# Patient Record
Sex: Female | Born: 1955 | Race: White | Hispanic: No | Marital: Married | Smoking: Former smoker
Health system: Southern US, Community
[De-identification: ages and names within clinical notes are randomized; demographics above are authoritative.]

## PROBLEM LIST (undated history)

## (undated) DIAGNOSIS — Z860100 Personal history of colon polyps, unspecified: Secondary | ICD-10-CM

## (undated) DIAGNOSIS — Z803 Family history of malignant neoplasm of breast: Secondary | ICD-10-CM

## (undated) DIAGNOSIS — C3490 Malignant neoplasm of unspecified part of unspecified bronchus or lung: Secondary | ICD-10-CM

## (undated) DIAGNOSIS — R0602 Shortness of breath: Secondary | ICD-10-CM

## (undated) DIAGNOSIS — E785 Hyperlipidemia, unspecified: Secondary | ICD-10-CM

## (undated) DIAGNOSIS — G4700 Insomnia, unspecified: Secondary | ICD-10-CM

## (undated) DIAGNOSIS — R911 Solitary pulmonary nodule: Secondary | ICD-10-CM

## (undated) DIAGNOSIS — K219 Gastro-esophageal reflux disease without esophagitis: Secondary | ICD-10-CM

## (undated) DIAGNOSIS — Z0000 Encounter for general adult medical examination without abnormal findings: Secondary | ICD-10-CM

## (undated) DIAGNOSIS — D869 Sarcoidosis, unspecified: Secondary | ICD-10-CM

## (undated) DIAGNOSIS — Z85828 Personal history of other malignant neoplasm of skin: Secondary | ICD-10-CM

## (undated) DIAGNOSIS — Z800 Family history of malignant neoplasm of digestive organs: Secondary | ICD-10-CM

## (undated) DIAGNOSIS — I493 Ventricular premature depolarization: Principal | ICD-10-CM

## (undated) DIAGNOSIS — Z6372 Alcoholism and drug addiction in family: Secondary | ICD-10-CM

## (undated) HISTORY — DX: Personal history of colonic polyps: Z86.010

## (undated) HISTORY — DX: Hyperlipidemia, unspecified: E78.5

## (undated) HISTORY — DX: Insomnia, unspecified: G47.00

## (undated) HISTORY — DX: Personal history of other malignant neoplasm of skin: Z85.828

## (undated) HISTORY — DX: Encounter for general adult medical examination without abnormal findings: Z00.00

## (undated) HISTORY — DX: Family history of malignant neoplasm of digestive organs: Z80.0

## (undated) HISTORY — DX: Gastro-esophageal reflux disease without esophagitis: K21.9

## (undated) HISTORY — DX: Family history of malignant neoplasm of breast: Z80.3

## (undated) HISTORY — DX: Personal history of colon polyps, unspecified: Z86.0100

## (undated) HISTORY — DX: Solitary pulmonary nodule: R91.1

## (undated) HISTORY — DX: Shortness of breath: R06.02

## (undated) HISTORY — DX: Alcoholism and drug addiction in family: Z63.72

## (undated) HISTORY — DX: Sarcoidosis, unspecified: D86.9

## (undated) HISTORY — DX: Ventricular premature depolarization: I49.3

## (undated) HISTORY — PX: TONSILLECTOMY: SHX5217

## (undated) HISTORY — DX: Malignant neoplasm of unspecified part of unspecified bronchus or lung: C34.90

## (undated) HISTORY — PX: BASAL CELL CARCINOMA EXCISION: SHX1214

---

## 1998-04-03 ENCOUNTER — Other Ambulatory Visit: Admission: RE | Admit: 1998-04-03 | Discharge: 1998-04-03 | Payer: Self-pay | Admitting: Obstetrics and Gynecology

## 1999-04-07 ENCOUNTER — Other Ambulatory Visit: Admission: RE | Admit: 1999-04-07 | Discharge: 1999-04-07 | Payer: Self-pay | Admitting: Obstetrics and Gynecology

## 2000-04-20 ENCOUNTER — Other Ambulatory Visit: Admission: RE | Admit: 2000-04-20 | Discharge: 2000-04-20 | Payer: Self-pay | Admitting: Obstetrics and Gynecology

## 2001-04-26 ENCOUNTER — Other Ambulatory Visit: Admission: RE | Admit: 2001-04-26 | Discharge: 2001-04-26 | Payer: Self-pay | Admitting: Obstetrics and Gynecology

## 2002-05-17 ENCOUNTER — Other Ambulatory Visit: Admission: RE | Admit: 2002-05-17 | Discharge: 2002-05-17 | Payer: Self-pay | Admitting: Obstetrics and Gynecology

## 2002-08-02 ENCOUNTER — Encounter: Admission: RE | Admit: 2002-08-02 | Discharge: 2002-09-11 | Payer: Self-pay | Admitting: Internal Medicine

## 2002-09-17 ENCOUNTER — Encounter: Admission: RE | Admit: 2002-09-17 | Discharge: 2002-10-16 | Payer: Self-pay | Admitting: Internal Medicine

## 2003-05-21 ENCOUNTER — Other Ambulatory Visit: Admission: RE | Admit: 2003-05-21 | Discharge: 2003-05-21 | Payer: Self-pay | Admitting: Obstetrics and Gynecology

## 2004-06-18 ENCOUNTER — Other Ambulatory Visit: Admission: RE | Admit: 2004-06-18 | Discharge: 2004-06-18 | Payer: Self-pay | Admitting: Obstetrics and Gynecology

## 2007-08-08 ENCOUNTER — Ambulatory Visit: Payer: Self-pay | Admitting: Internal Medicine

## 2007-08-08 LAB — CONVERTED CEMR LAB
ALT: 12 units/L (ref 0–35)
Basophils Absolute: 0 10*3/uL (ref 0.0–0.1)
Basophils Relative: 0.8 % (ref 0.0–3.0)
CO2: 28 meq/L (ref 19–32)
Calcium: 9 mg/dL (ref 8.4–10.5)
Cholesterol: 235 mg/dL (ref 0–200)
Creatinine, Ser: 1 mg/dL (ref 0.4–1.2)
Direct LDL: 152.6 mg/dL
Eosinophils Absolute: 0.1 10*3/uL (ref 0.0–0.7)
Hemoglobin: 13.9 g/dL (ref 12.0–15.0)
Ketones, ur: NEGATIVE mg/dL
Leukocytes, UA: NEGATIVE
Lymphocytes Relative: 29.6 % (ref 12.0–46.0)
Monocytes Relative: 6.6 % (ref 3.0–12.0)
Neutro Abs: 3.3 10*3/uL (ref 1.4–7.7)
Neutrophils Relative %: 60.3 % (ref 43.0–77.0)
Nitrite: NEGATIVE
RBC: 4.42 M/uL (ref 3.87–5.11)
Specific Gravity, Urine: 1.015 (ref 1.000–1.03)
TSH: 4.4 microintl units/mL (ref 0.35–5.50)
Total Bilirubin: 1.5 mg/dL — ABNORMAL HIGH (ref 0.3–1.2)
Urine Glucose: NEGATIVE mg/dL
Urobilinogen, UA: 0.2 (ref 0.0–1.0)
VLDL: 23 mg/dL (ref 0–40)
WBC: 5.3 10*3/uL (ref 4.5–10.5)
pH: 7.5 (ref 5.0–8.0)

## 2007-08-11 ENCOUNTER — Ambulatory Visit: Payer: Self-pay | Admitting: Internal Medicine

## 2007-08-11 DIAGNOSIS — Z860100 Personal history of colon polyps, unspecified: Secondary | ICD-10-CM | POA: Insufficient documentation

## 2007-08-11 DIAGNOSIS — E785 Hyperlipidemia, unspecified: Secondary | ICD-10-CM

## 2008-04-10 ENCOUNTER — Encounter: Payer: Self-pay | Admitting: Internal Medicine

## 2008-05-16 ENCOUNTER — Encounter: Payer: Self-pay | Admitting: Internal Medicine

## 2008-12-24 ENCOUNTER — Ambulatory Visit: Payer: Self-pay | Admitting: Internal Medicine

## 2008-12-24 ENCOUNTER — Encounter: Payer: Self-pay | Admitting: Internal Medicine

## 2008-12-24 ENCOUNTER — Telehealth: Payer: Self-pay | Admitting: Internal Medicine

## 2008-12-24 DIAGNOSIS — G4700 Insomnia, unspecified: Secondary | ICD-10-CM | POA: Insufficient documentation

## 2008-12-24 DIAGNOSIS — R0602 Shortness of breath: Secondary | ICD-10-CM | POA: Insufficient documentation

## 2008-12-24 DIAGNOSIS — J984 Other disorders of lung: Secondary | ICD-10-CM

## 2008-12-24 LAB — CONVERTED CEMR LAB
Basophils Relative: 1.2 % (ref 0.0–3.0)
Calcium: 8.9 mg/dL (ref 8.4–10.5)
Eosinophils Absolute: 0.1 10*3/uL (ref 0.0–0.7)
Eosinophils Relative: 1.5 % (ref 0.0–5.0)
Glucose, Bld: 86 mg/dL (ref 70–99)
Lymphocytes Relative: 27.8 % (ref 12.0–46.0)
MCHC: 34.1 g/dL (ref 30.0–36.0)
Monocytes Absolute: 0.3 10*3/uL (ref 0.1–1.0)
Neutrophils Relative %: 64.4 % (ref 43.0–77.0)
Platelets: 312 10*3/uL (ref 150.0–400.0)
Potassium: 4.1 meq/L (ref 3.5–5.1)
RBC: 4.41 M/uL (ref 3.87–5.11)
Sed Rate: 12 mm/hr (ref 0–22)
Sodium: 140 meq/L (ref 135–145)
TSH: 2.86 microintl units/mL (ref 0.35–5.50)
WBC: 5.5 10*3/uL (ref 4.5–10.5)

## 2009-01-02 ENCOUNTER — Encounter: Payer: Self-pay | Admitting: Internal Medicine

## 2009-01-02 ENCOUNTER — Ambulatory Visit: Payer: Self-pay | Admitting: Internal Medicine

## 2009-01-04 HISTORY — PX: LUNG SURGERY: SHX703

## 2009-01-04 HISTORY — PX: MEDIASTINOSCOPY: SHX5086

## 2009-01-08 ENCOUNTER — Ambulatory Visit: Payer: Self-pay | Admitting: Internal Medicine

## 2009-01-08 ENCOUNTER — Encounter: Payer: Self-pay | Admitting: Internal Medicine

## 2009-01-08 DIAGNOSIS — Z85828 Personal history of other malignant neoplasm of skin: Secondary | ICD-10-CM

## 2009-01-10 ENCOUNTER — Ambulatory Visit (HOSPITAL_COMMUNITY): Admission: RE | Admit: 2009-01-10 | Discharge: 2009-01-10 | Payer: Self-pay | Admitting: Internal Medicine

## 2009-01-10 ENCOUNTER — Telehealth: Payer: Self-pay | Admitting: Internal Medicine

## 2009-01-13 ENCOUNTER — Telehealth: Payer: Self-pay | Admitting: Internal Medicine

## 2009-01-15 ENCOUNTER — Encounter: Payer: Self-pay | Admitting: Internal Medicine

## 2009-01-15 ENCOUNTER — Ambulatory Visit: Payer: Self-pay | Admitting: Thoracic Surgery

## 2009-01-17 ENCOUNTER — Ambulatory Visit: Payer: Self-pay | Admitting: Thoracic Surgery

## 2009-01-20 ENCOUNTER — Ambulatory Visit: Payer: Self-pay | Admitting: Internal Medicine

## 2009-01-20 ENCOUNTER — Telehealth: Payer: Self-pay | Admitting: Internal Medicine

## 2009-01-27 ENCOUNTER — Encounter: Payer: Self-pay | Admitting: Thoracic Surgery

## 2009-01-27 ENCOUNTER — Inpatient Hospital Stay (HOSPITAL_COMMUNITY): Admission: RE | Admit: 2009-01-27 | Discharge: 2009-01-31 | Payer: Self-pay | Admitting: Thoracic Surgery

## 2009-01-27 ENCOUNTER — Ambulatory Visit: Payer: Self-pay | Admitting: Thoracic Surgery

## 2009-02-07 ENCOUNTER — Encounter: Admission: RE | Admit: 2009-02-07 | Discharge: 2009-02-07 | Payer: Self-pay | Admitting: Thoracic Surgery

## 2009-02-07 ENCOUNTER — Ambulatory Visit: Payer: Self-pay | Admitting: Thoracic Surgery

## 2009-02-10 ENCOUNTER — Ambulatory Visit: Payer: Self-pay | Admitting: Internal Medicine

## 2009-02-14 ENCOUNTER — Ambulatory Visit: Payer: Self-pay | Admitting: Thoracic Surgery

## 2009-02-28 ENCOUNTER — Encounter: Payer: Self-pay | Admitting: Internal Medicine

## 2009-02-28 ENCOUNTER — Encounter: Admission: RE | Admit: 2009-02-28 | Discharge: 2009-02-28 | Payer: Self-pay | Admitting: Thoracic Surgery

## 2009-02-28 ENCOUNTER — Ambulatory Visit: Payer: Self-pay | Admitting: Thoracic Surgery

## 2009-03-03 ENCOUNTER — Encounter: Payer: Self-pay | Admitting: Internal Medicine

## 2009-03-03 LAB — COMPREHENSIVE METABOLIC PANEL
ALT: 12 U/L (ref 0–35)
Albumin: 4.5 g/dL (ref 3.5–5.2)
CO2: 23 mEq/L (ref 19–32)
Calcium: 9.2 mg/dL (ref 8.4–10.5)
Chloride: 105 mEq/L (ref 96–112)
Creatinine, Ser: 1 mg/dL (ref 0.40–1.20)
Potassium: 4.2 mEq/L (ref 3.5–5.3)
Sodium: 139 mEq/L (ref 135–145)
Total Protein: 7.1 g/dL (ref 6.0–8.3)

## 2009-03-03 LAB — CBC WITH DIFFERENTIAL/PLATELET
BASO%: 0.6 % (ref 0.0–2.0)
MCH: 30.4 pg (ref 25.1–34.0)
MCHC: 33.9 g/dL (ref 31.5–36.0)
MCV: 89.8 fL (ref 79.5–101.0)
NEUT#: 4.6 10*3/uL (ref 1.5–6.5)
NEUT%: 70.1 % (ref 38.4–76.8)
RBC: 4.45 10*6/uL (ref 3.70–5.45)
RDW: 13.6 % (ref 11.2–14.5)

## 2009-04-30 ENCOUNTER — Encounter: Admission: RE | Admit: 2009-04-30 | Discharge: 2009-04-30 | Payer: Self-pay | Admitting: Thoracic Surgery

## 2009-04-30 ENCOUNTER — Ambulatory Visit: Payer: Self-pay | Admitting: Thoracic Surgery

## 2009-08-21 ENCOUNTER — Ambulatory Visit: Payer: Self-pay | Admitting: Internal Medicine

## 2009-08-25 ENCOUNTER — Ambulatory Visit (HOSPITAL_COMMUNITY): Admission: RE | Admit: 2009-08-25 | Discharge: 2009-08-25 | Payer: Self-pay | Admitting: Internal Medicine

## 2009-08-25 LAB — CBC WITH DIFFERENTIAL/PLATELET
BASO%: 0.8 % (ref 0.0–2.0)
Basophils Absolute: 0 10*3/uL (ref 0.0–0.1)
EOS%: 1.8 % (ref 0.0–7.0)
Eosinophils Absolute: 0.1 10*3/uL (ref 0.0–0.5)
HCT: 38.7 % (ref 34.8–46.6)
HGB: 13.4 g/dL (ref 11.6–15.9)
LYMPH%: 19 % (ref 14.0–49.7)
MCH: 30.6 pg (ref 25.1–34.0)
MCHC: 34.5 g/dL (ref 31.5–36.0)
MCV: 88.8 fL (ref 79.5–101.0)
MONO#: 0.3 10*3/uL (ref 0.1–0.9)
MONO%: 5.6 % (ref 0.0–14.0)
NEUT#: 3.5 10*3/uL (ref 1.5–6.5)
NEUT%: 72.8 % (ref 38.4–76.8)
Platelets: 289 10*3/uL (ref 145–400)
RBC: 4.36 10*6/uL (ref 3.70–5.45)
RDW: 13.6 % (ref 11.2–14.5)
WBC: 4.8 10*3/uL (ref 3.9–10.3)
lymph#: 0.9 10*3/uL (ref 0.9–3.3)

## 2009-08-25 LAB — COMPREHENSIVE METABOLIC PANEL
AST: 19 U/L (ref 0–37)
Albumin: 3.8 g/dL (ref 3.5–5.2)
BUN: 14 mg/dL (ref 6–23)
Calcium: 8.7 mg/dL (ref 8.4–10.5)
Chloride: 107 mEq/L (ref 96–112)
Potassium: 4.3 mEq/L (ref 3.5–5.3)

## 2009-09-01 ENCOUNTER — Encounter: Payer: Self-pay | Admitting: Internal Medicine

## 2009-09-03 ENCOUNTER — Ambulatory Visit (HOSPITAL_COMMUNITY): Admission: RE | Admit: 2009-09-03 | Discharge: 2009-09-03 | Payer: Self-pay | Admitting: Internal Medicine

## 2009-09-04 ENCOUNTER — Ambulatory Visit: Payer: Self-pay | Admitting: Thoracic Surgery

## 2009-09-04 ENCOUNTER — Ambulatory Visit: Payer: Self-pay | Admitting: Internal Medicine

## 2009-09-04 ENCOUNTER — Encounter: Payer: Self-pay | Admitting: Internal Medicine

## 2009-09-10 ENCOUNTER — Ambulatory Visit (HOSPITAL_COMMUNITY): Admission: RE | Admit: 2009-09-10 | Discharge: 2009-09-10 | Payer: Self-pay | Admitting: Thoracic Surgery

## 2009-09-10 ENCOUNTER — Ambulatory Visit: Payer: Self-pay | Admitting: Thoracic Surgery

## 2009-09-10 ENCOUNTER — Encounter: Payer: Self-pay | Admitting: Internal Medicine

## 2009-09-16 ENCOUNTER — Encounter: Payer: Self-pay | Admitting: Internal Medicine

## 2009-09-16 ENCOUNTER — Ambulatory Visit: Payer: Self-pay | Admitting: Thoracic Surgery

## 2009-09-18 ENCOUNTER — Ambulatory Visit: Payer: Self-pay | Admitting: Internal Medicine

## 2009-09-18 DIAGNOSIS — C3490 Malignant neoplasm of unspecified part of unspecified bronchus or lung: Secondary | ICD-10-CM | POA: Insufficient documentation

## 2009-10-07 ENCOUNTER — Ambulatory Visit: Payer: Self-pay | Admitting: Thoracic Surgery

## 2009-10-14 ENCOUNTER — Encounter: Payer: Self-pay | Admitting: Internal Medicine

## 2009-11-21 ENCOUNTER — Ambulatory Visit: Payer: Self-pay | Admitting: Internal Medicine

## 2009-12-16 ENCOUNTER — Ambulatory Visit (HOSPITAL_COMMUNITY): Admission: RE | Admit: 2009-12-16 | Payer: Self-pay | Source: Home / Self Care | Admitting: Internal Medicine

## 2010-01-23 ENCOUNTER — Other Ambulatory Visit: Payer: Self-pay | Admitting: Internal Medicine

## 2010-01-23 DIAGNOSIS — C3490 Malignant neoplasm of unspecified part of unspecified bronchus or lung: Secondary | ICD-10-CM

## 2010-01-30 ENCOUNTER — Other Ambulatory Visit (HOSPITAL_COMMUNITY): Payer: Self-pay

## 2010-02-05 NOTE — Letter (Signed)
[facility]   Imported By: Sherian Rein 06/24/2009 15:09:45  _____________________________________________________________________  External Attachment:    Type:   Image     Comment:   External Document

## 2010-02-05 NOTE — Assessment & Plan Note (Signed)
Summary: PPD/KLW  Nurse Visit   Allergies: 1)  Amoxicillin 2)  * General Anesthetic (Which?)  Immunizations Administered:  PPD Skin Test:    Vaccine Type: PPD    Site: left forearm    Mfr: Sanofi Pasteur    Dose: 0.1 ml    Route: ID    Given by: Gweneth Dimitri RN    Exp. Date: 03/12/2011    Lot #: [id number]  PPD Results    Date of reading: 01/22/2009    Results: < 5mm    Interpretation: negative  PPD was read by Katherine Mantle.Reynaldo Minium CMA  January 24, 2009 4:50 PM      Orders Added: 1)  TB Skin Test [86580] 2)  Admin 1st Vaccine [phone]

## 2010-02-05 NOTE — Assessment & Plan Note (Signed)
Summary: ebus done 09/07/apc   Copy to:  Dr. Oliver Barre Primary Provider/Referring Provider:  Dr. 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 Over the last 2-3 months she had noted cough, change in voice and easier exertional dyspnea.  She denies weight loss, fever, nodes, cough, phlegm, chest pain or other systemic symptoms and otherwise felt well. CXR and then CT showed a concerning LUL spiculated 1.7 cm nodule without associated changes. PFT 01/02/09- WNL FEV1/FVC 0.83, Nl volumes and DLCO. 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 Recent normal gyn exams. No known TB exposure. Remote PPD Neg, Had Flu vax.  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 No adjuvant therapy. Dr Shirline Frees did f/u CXR finding adenopathy and sent her back to Dr Edwyna Shell who did needle bx then mediastinoscopy finding ?Sarcoid. Noncaseating granulomas.  She was camping in [place] around August 1. [place],  years ago,  and lived many years ago in [place] near the [place]. PPD negative again January, 2011. Never cleared raw feeling in throat. Since mediastinoscopy has felt tired, had transient fever, remains a little more hoarse. Was having some night sweats, no nodes or cough. Had felt great.   Preventive Screening-Counseling & Management  Alcohol-Tobacco     Smoking Status: quit     Year Quit: 2010  Current Medications (verified): 1)  Zolpidem Tartrate 10 Mg Tabs (Zolpidem Tartrate) .Marland Kitchen.. 1 By Mouth At Bedtime As Needed 2)  Doxycycline Hyclate 100 Mg Caps (Doxycycline Hyclate) .... Take 1 By Mouth Two  Times A Day 3)  Motrin Ib 200 Mg Tabs (Ibuprofen) .... 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 [place]ived [place] 2 years Quit smoking 10/04/08 Alcohol use-yes 1 son Married/ She is primary provider husband with advanced Parkinsons'  Smoking Status:  quit  Review of Systems      See HPI       The patient complains of hoarseness.  The patient denies anorexia, fever, weight loss, weight gain, vision loss, decreased hearing, chest pain, syncope, dyspnea on exertion, peripheral edema, prolonged cough, headaches, hemoptysis, abdominal pain, severe indigestion/heartburn, muscle weakness, unusual weight change, enlarged lymph nodes, and breast masses.    Vital Signs:  Patient profile:   55 year old female Height:      64 inches Weight:      153 pounds BMI:     26.36 O2 Sat:      98 % on Room air Pulse rate:   92 / minute BP sitting:   126 / 66  (left arm) Cuff size:   regular  Vitals Entered By: Reynaldo Minium CMA (September 18, 2009 1:50 PM)  O2  Flow:  Room air CC: fOLLOW UP VISIT-dyspnea and recent surgery.   Physical Exam  Additional Exam:  General: A/Ox3; pleasant and cooperative, NAD, WDWN SKIN: no rash, lesions NODES: no lymphadenopathy HEENT: [place]/AT, EOM- WNL, Conjuctivae- clear, PERRLA, TM-WNL, Nose- clear, Throat- clear and wnl, Mallampati II NECK: Supple w/ fair  ROM, JVD- none, normal carotid impulses w/o bruits . Mediastinoscopy incision healing CHEST: Clear to P&A, no rales, wheeze, rub, dullness or cough HEART: RRR, no m/g/r heard ABDOMEN: Soft and nl; nml bowel sounds; no organomegaly or masses noted AOZ:HYQM, nl pulses, no edema  NEURO: Grossly intact to observation      Impression & Recommendations:  Problem # 1:  ? 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 We will get ACE level now and allow her to get over her latest surgery. I will see her again in 2 months unless needed earlier,.Sarcoid is the likely dx, but she has a list of potential exposures that might cause granulomatous disease. I anticipate conservative follow-up unless a late culture result or clinical course suggest otherwise.   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  Medications Added to Medication List This Visit: 1)  Doxycycline Hyclate 100 Mg Caps (Doxycycline hyclate) .... Take 1 by mouth two times a day 2)  Motrin Ib 200 Mg Tabs (Ibuprofen) .... Take as directed per box as needed  Other Orders: Est. Patient Level IV (57846) T-Angiotensin i-Converting Enzyme ([id number])  Patient Instructions: 1)  Please schedule a follow-up appointment in 2 months. 2)  Lab 3)  Don't forget to get your flu shot at work

## 2010-02-05 NOTE — Letter (Signed)
Summary: [facility]  [facility]   Imported By: Lennie Odor 11/04/2009 12:58:20  _____________________________________________________________________  External Attachment:    Type:   Image     Comment:   External Document

## 2010-02-05 NOTE — Progress Notes (Signed)
Summary: talk to nurse  Phone Note Call from Patient Call back at [phone]   Caller: Patient Call For: Dawn Campos Summary of Call: need to talk to nurse about condition Initial call taken by: Rickard Patience,  January 13, 2009 9:15 AM  Follow-up for Phone Call        [facility].Reynaldo Minium CMA  January 13, 2009 9:40 AM    Dr. Bunnie Pion returned my call; will be coming by to get PET scan results from medical records and was asking about TB test; understands that I will speak with CDY when he returns to the [facility] get it placed on Wednesday and read on Friday. Pt would also like CDY to know that she swallowed a fly sometime last year and would like to know if that could be contributing to anything going on. Reynaldo Minium CMA  January 13, 2009 9:58 AM   Additional Follow-up for Phone Call Additional follow up Details #1::        OK to place and read PPD.  Doubt the fly had anything to do with anything. Additional Follow-up by: Waymon Budge MD,  January 15, 2009 1:24 PM    Additional Follow-up for Phone Call Additional follow up Details #2::    LMTCB. Carron Curie CMA  January 15, 2009 2:49 PM  pt returned call.  advised of CDY's response.  pt stated that she saw her surgeon yesterday and spoke with him about her PPD and if she had it placed would she still have to have the sugery.  patient would like to know if the PPD is necessary-if not, she will forego this.  please advise, thanks! Boone Master CNA  January 16, 2009 9:54 AM     Per Lasandra Beech- he advises that pt get PPD placed no matter what. Reynaldo Minium CMA  January 16, 2009 1:22 PM   LMTCB. Carron Curie CMA  January 16, 2009 2:40 PM  pt advised and states she will come by and have PPD placed on Monday. Carron Curie CMA  January 17, 2009 3:17 PM

## 2010-02-05 NOTE — Assessment & Plan Note (Signed)
Summary: LUNG MASS/CB   Copy to:  Dr. Oliver Barre Primary Provider/Referring Provider:  Dr. Oliver Barre  CC:  Pulmonary Consult.  SOB when talking x2-74mo.  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 Over the last 2-3 months she had noted cough, change in voice and easier exertional dyspnea.  She denies weight loss, fever, nodes, cough, phlegm, chest pain or other systemic symptoms and otherwise felt well. CXR and then CT showed a concerning LUL spiculated 1.7 cm nodule without associated changes. PFT 01/02/09- WNL FEV1/FVC 0.83, Nl volumes and DLCO. 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 Recent normal gyn exams. No known TB exposure. Remote PPD Neg, Had Flu vax.  Current Medications (verified): 1)  Zolpidem Tartrate 10 Mg Tabs (Zolpidem Tartrate) .Dawn Campos.. 1 By Mouth At Bedtime As Needed 2)  Finacea 15 % Gel (Azelaic Acid) .... Two Times A Day 3)  Alprazolam 0.5 Mg Tabs (Alprazolam) .... 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 Lived [place] 2 years Current Smoker - very rare cig x69yrs. 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 Lived [place] x 2 years Current Smoker - very rare cig x35yrs. Alcohol use-yes 1 son Married/ She is primary provider husband with advanced Parkinsons'  Review of Systems       The patient complains of shortness of breath at rest, non-productive cough, chest pain, irregular heartbeats, loss of appetite, itching, anxiety, and rash.  The patient denies shortness of breath with activity, productive cough, coughing up blood, acid heartburn, indigestion, weight change, abdominal pain, difficulty swallowing, sore throat, tooth/dental problems, headaches, nasal congestion/difficulty breathing through nose, sneezing, ear ache, depression, hand/feet swelling, joint stiffness or pain, change in color of mucus, and fever.    Vital Signs:  Patient profile:   55 year old female Height:      64 inches Weight:      153.25 pounds BMI:     26.40 O2 Sat:      98 % on Room air Pulse rate:   78 / minute BP sitting:   148 / 86  (left arm) Cuff size:   regular  Vitals Entered By: Gweneth Dimitri RN (January 08, 2009 11:29 AM)  O2 Flow:  Room air CC: Pulmonary Consult.  SOB when talking x2-54mo.  abdnormal CT in Dec 2010. Comments Medications reviewed with patient Gweneth Dimitri RN  January 08, 2009 11:29 AM    Physical Exam  Additional Exam:  General: A/Ox3; pleasant and cooperative, NAD, WDWN SKIN: no rash, lesions NODES: no lymphadenopathy HEENT: [place]/AT, EOM- WNL, Conjuctivae- clear, PERRLA, TM-WNL, Nose- clear, Throat- clear and wnl,  [place]  II NECK: Supple w/ fair ROM, JVD- none, normal carotid impulses w/o bruits Thyroid- normal to palpation CHEST: Clear to P&A, no rales, wheeze, rub, dullness or cough HEART: RRR, no m/g/r heard ABDOMEN: Soft and nl; nml bowel sounds; no organomegaly or masses noted AVW:[id number], nl pulses, no edema  NEURO: Grossly intact to observation      CT of Chest  Procedure date:  01/02/2009  Findings:      CT CHEST W/CM - [id number]   Clinical Data: Pulmonary nodule in the left upper lobe.  Chest pain.  Dry cough.   CT CHEST WITH CONTRAST   Technique:  Multidetector CT imaging of the chest was performed following the standard protocol during bolus administration of intravenous contrast.   Contrast: 80 ml Omnipaque-300   Comparison: 12/24/2008   Findings: The left upper lobe nodular density seen at chest radiography is a spiculated lesion anteriorly in the left upper lobe, measuring 1.7 x 1.3 x 0.9 cm.  The appearance is suspicious for bronchogenic carcinoma, with an inflammatory focus considered less likely given the lack of significant change over the past 9 days.   No pathologically enlarged thoracic adenopathy is identified.  The no adrenal mass identified.   IMPRESSION:   1.  Left upper lobe spiculated nodule is suspicious for bronchogenic carcinoma.  Consider PET CT or resection.   Read By:  Dellia Cloud,  M.D.     Released By:  Dellia Cloud,  M.D.   Impression & Recommendations:  Problem # 1:  LUNG NODULE (ICD-518.89)  Isolated peripheral lung nodule of uncertain age in an otherwise healthy woman. Light smoking hx. Has veterenary experience, so a granulomatous or parasitic infection is not excluded. Old Cocci granuloma could look like this. We discussed options. PFTs are normal.  We will get PET then anticipate direct referral for resection. She is concerned about her role as family provider and doesn't want this issue to linger.  Problem #  2:  DYSPNEA (ICD-786.05) PFT's, exam and CT provide no basis for specific concern. this may have been a mild viral bronchitis type illness. For now we can watch it.  Medications Added to Medication List This Visit: 1)  Finacea 15 % Gel (Azelaic acid) .... Two times a day 2)  Alprazolam 0.5 Mg Tabs (Alprazolam) .... As needed  Other Orders: Consultation Level III [phone]) Radiology Referral (Radiology)  Patient Instructions: 1)  Please schedule a follow-up appointment as needed. 2)  See [facility] to schedule PET   Immunization History:  Influenza Immunization History:    Influenza:  historical (10/04/2008)   CT of Chest  Procedure date:  01/02/2009  Findings:      CT CHEST W/CM - [id number]   Clinical Data: Pulmonary nodule in the left upper lobe.  Chest pain.  Dry cough.   CT CHEST WITH CONTRAST   Technique:  Multidetector CT imaging of the chest was performed following the standard protocol during bolus administration of intravenous contrast.   Contrast: 80 ml Omnipaque-300   Comparison: 12/24/2008   Findings: The left upper lobe nodular density seen at chest radiography is a spiculated lesion anteriorly in the left upper lobe, measuring 1.7 x 1.3 x 0.9 cm.  The appearance is suspicious for bronchogenic carcinoma,  with an inflammatory focus considered less likely given the lack of significant change over the past 9 days.   No pathologically enlarged thoracic adenopathy is identified.  The no adrenal mass identified.   IMPRESSION:   1.  Left upper lobe spiculated nodule is suspicious for bronchogenic carcinoma.  Consider PET CT or resection.   Read By:  Dellia Cloud,  M.D.     Released By:  Dellia Cloud,  Judie Petit.D.

## 2010-02-05 NOTE — Progress Notes (Signed)
Summary: results  Phone Note Call from Patient Call back at Home Phone [phone]   Caller: Patient Call For: young Reason for Call: Talk to Nurse, Lab or Test Results Summary of Call: pt anxious re: PET scan done today.  Wants to know if CDY might be able to get results called to her today.  Says she would like to have before w/e. Initial call taken by: Eugene Gavia,  January 10, 2009 4:41 PM  Follow-up for Phone Call        please advise.  report attached. Carron Curie CMA  January 10, 2009 4:47 PM   Additional Follow-up for Phone Call Additional follow up Details #1::        i called her and reviewed PET. She wishes to proceed to Thoracic surgical evaluation. Additional Follow-up by: Waymon Budge MD,  January 10, 2009 5:12 PM    Additional Follow-up for Phone Call Additional follow up Details #2::    Called and spoke with pt and advised that we would be faxing information to [facility]. I advised that I would call her first part of the week with an appt. Alfonso Ramus  January 10, 2009 5:24 PM Referral and records faxed to [facility].  Waiting on Appt. Alfonso Ramus  January 13, 2009 9:38 AM Appt scheduled with Dr. Edwyna Shell for Wed. 01/15/09 at 3:00. Pt to arrive at 2:45. Called and spoke with pt and pt is aware of appt date, time and location. Rhonda Cobb  January 13, 2009 10:40 AM

## 2010-02-05 NOTE — Assessment & Plan Note (Signed)
Summary: f/u ///kp   Copy to:  Dr. Oliver Barre Primary Dawn Campos/Referring Abel Hageman:  Dr. 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 No adjuvant therapy. Dr Shirline Frees did f/u CXR finding adenopathy and sent her back to Dr Edwyna Shell who did needle bx then mediastinoscopy finding ?Sarcoid. Noncaseating granulomas.  She was camping in [place] around August 1. [place],  [place],  and lived many years ago in [place] near the [place]. PPD negative again January, 2011. Never cleared raw feeling in throat. Since mediastinoscopy has felt tired, had transient fever, remains a little more hoarse. Was having some night sweats, no nodes or cough. Had felt great.  November 21, 2009- Adenocarcinoma, Sarcoid Nurse-CC: Follow up visit-breathing good. Hx resected adenoca lung. Then had adenopathy and second bx dxing sarcoid, w/ elevated ACE.  Denies fever night sweat, cough or wheeze. Incidental cold now, otherwise has felt well.. Questions fliuid left ear.   Preventive Screening-Counseling & Management  Alcohol-Tobacco     Smoking Status: quit     Year Quit: 2010  Current Medications (verified): 1)  Zolpidem Tartrate 10 Mg Tabs (Zolpidem Tartrate) .Marland Kitchen.. 1 By Mouth At Bedtime As Needed 2)  Motrin Ib 200 Mg Tabs (Ibuprofen) .... Take As Directed Per Box As Needed 3)  Diphenhydramine Hcl 25 Mg Tabs (Diphenhydramine Hcl) .... Take As Directed As Needed  Allergies (verified): 1)  Amoxicillin 2)  * General Anesthetic (Which?)  Past History:  Past Medical History: Last updated: 09/18/2009 Hyperlipidemia Colonic polyps, hx of chronic constipation hx of basel cell carcinoma Adenocrcinoma lung- resected 2011 Mediastinal adenopathy- noncaseating granulomas 2011  Past Surgical History: Last updated: 09/18/2009 Caesarean  section-1995 Tonsillectomy s/p basal cell cancer Moh's surgery nose Resection lung nodule- adenocarcinoma 2011- Dr Karna Dupes Mediastinoscopy- noncaseating granulomas 2011  Family History: Last updated: 08/11/2007 Family History of Alcoholism/Addiction - grandmother Family History of Arthritis - mother and grandmother Family History Breast cancer  - great aunt Family History of Colon CA - grandfather Family History High cholesterol - brother and father Family History Lung cancer 0 grandfather mother with ovary cancer  grandmother with leukemia  Social History: Last updated: 09/18/2009 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 [place]ived [place] years Quit smoking 10/04/08 Alcohol use-yes 1 son Married/ She is primary Duglas Heier husband with advanced Parkinsons'  Risk Factors: Smoking Status: quit (11/21/2009)  Review of Systems      See HPI  The patient denies shortness of breath with activity, shortness of breath at rest, productive cough, non-productive cough, coughing up blood, chest pain, irregular heartbeats, acid heartburn, indigestion, loss of appetite, weight change, abdominal pain, difficulty swallowing, sore throat, tooth/dental problems, headaches, nasal congestion/difficulty breathing through nose, sneezing, itching, hand/feet swelling, joint stiffness or pain, rash, change in color of mucus, and fever.    Vital Signs:  Patient profile:   55 year old female Height:      64 inches Weight:      159.25 pounds BMI:     27.43 O2 Sat:      100 % on Room air Pulse rate:   71 / minute BP sitting:   118 / 80  (left arm) Cuff size:   regular  Vitals Entered By: Reynaldo Minium CMA (November 21, 2009 4:46 PM)  O2 Flow:  Room air CC: Follow up visit-breathing  good.   Physical Exam  Additional Exam:  General: A/Ox3; pleasant and cooperative, NAD, WDWN SKIN: no rash, lesions NODES: no  lymphadenopathy HEENT: [place]/AT, EOM- WNL, Conjuctivae- clear, PERRLA, TM-WNL, Nose- clear, Throat- clear and wnl, Mallampati II NECK: Supple w/ fair ROM, JVD- none, normal carotid impulses w/o bruits . Mediastinoscopy incision healing CHEST: Clear to P&A, no rales, rub, dullness or cough, very slight wheeze left upper back. HEART: RRR, no m/g/r heard ABDOMEN: Soft and nl; nml bowel sounds; no organomegaly or masses noted ZOX:WRUE, nl pulses, no edema  NEURO: Grossly intact to observation      Impression & Recommendations:  Problem # 1:  ? 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 Discussed as asymptomatic and of concern mainly for differential dx. We will recheck her w/ CXR in 4 months unless Dr Shirline Frees gets CT. I don't anticipate we will need to treat at this point. We reviewed common symptoms and organ involvement issues which can be followed.  We discussed and recommended pneumovax for her. She has an incidental cold now which is clearing on its own.   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   Medications Added to Medication List This Visit: 1)  Diphenhydramine Hcl 25 Mg Tabs (Diphenhydramine hcl) .... Take as directed as needed  Other Orders: Est. Patient Level III (45409) Pneumococcal Vaccine (81191) Admin 1st Vaccine (47829)  Patient Instructions: 1)  Please schedule a follow-up appointment in 4 months. 2)  Please check with Dr Shirline Frees about his follow-up plans. 3)  Pneumovax   Immunizations Administered:  Pneumonia Vaccine:    Vaccine Type: Pneumovax    Site: left deltoid    Mfr: Merck    Dose: 0.5 ml    Route: IM    Given by: Reynaldo Minium CMA    Exp. Date: 05/01/2011    Lot #: [id number]    VIS given: 12/09/08 version given November 21, 2009.  Immunizations Administered:  Pneumonia Vaccine:    Vaccine Type: Pneumovax    Site: left deltoid    Mfr: Merck    Dose: 0.5 ml    Route:  IM    Given by: Reynaldo Minium CMA    Exp. Date: 05/01/2011    Lot #: [id number]    VIS given: 12/09/08 version given November 21, 2009.

## 2010-02-05 NOTE — Letter (Signed)
Summary: [facility]  [facility]   Imported By: Lennie Odor 09/29/2009 12:15:55  _____________________________________________________________________  External Attachment:    Type:   Image     Comment:   External Document

## 2010-02-05 NOTE — Consult Note (Signed)
Summary: [facility]  [facility]   Imported By: Sherian Rein 01/29/2009 11:21:03  _____________________________________________________________________  External Attachment:    Type:   Image     Comment:   External Document

## 2010-02-05 NOTE — Progress Notes (Signed)
Summary: Questions for CY-LMTCB x 1  Phone Note Other Incoming   Caller: Patient Summary of Call: While in for PPD placement, pt had several questions for CY.  1.  would like to know exactly where in the body the nodule is at that is being removed.  2.  states she has not been able to sleep the past several nights due to anxiety from upcoming surgery.  would like to know if she can have xanax until the surgery to help her sleep.  3.  Was told there is a possibility of blood loss during the surgery.  Would like to know if family members can donate blood to hold for surgery in case she needs it.  Informed pt CY was seeing pts at that time but will take a message and someone will call her back with CY's recs/answers.  She is ok with that.  Will forward message to CY-please advise.  Thanks!   Initial call taken by: Gweneth Dimitri RN,  January 20, 2009 12:04 PM  Follow-up for Phone Call        1) Nodule is in left upper lobe- the upper section of the left lung. 2) OK for Xanax- script added to med list. Please send. 3) Questions about blood replacement and donation should be discussed with th surgeon. Follow-up by: Waymon Budge MD,  January 20, 2009 1:49 PM  Additional Follow-up for Phone Call Additional follow up Details #1::        LMOMTCB Vernie Murders  January 20, 2009 2:03 PM  pt advised of recs. rx [facility] on [address]. Carron Curie CMA  January 20, 2009 4:25 PM      New/Updated Medications: ALPRAZOLAM 0.5 MG TABS (ALPRAZOLAM) 1 three times a day as needed Prescriptions: ALPRAZOLAM 0.5 MG TABS (ALPRAZOLAM) 1 three times a day as needed  #30 x 3   Entered by:   Waymon Budge MD   Authorized by:   Pulmonary Triage   Signed by:   Waymon Budge MD on 01/20/2009   Method used:   Historical   RxID:   [id number]

## 2010-02-05 NOTE — Letter (Signed)
Summary: [facility]  [facility]   Imported By: Lester [place] 09/18/2009 08:41:04  _____________________________________________________________________  External Attachment:    Type:   Image     Comment:   External Document

## 2010-02-05 NOTE — Letter (Signed)
Summary: [facility]  [facility]   Imported By: Lester  09/18/2009 08:40:00  _____________________________________________________________________  External Attachment:    Type:   Image     Comment:   External Document

## 2010-02-06 NOTE — Letter (Signed)
Summary: [facility]  [facility]   Imported By: Sherian Rein 03/14/2009 11:58:37  _____________________________________________________________________  External Attachment:    Type:   Image     Comment:   External Document

## 2010-03-02 ENCOUNTER — Ambulatory Visit (HOSPITAL_COMMUNITY)
Admission: RE | Admit: 2010-03-02 | Discharge: 2010-03-02 | Disposition: A | Discharge status: Home / Self Care | Payer: BC Managed Care – PPO | Source: Ambulatory Visit | Attending: Internal Medicine | Admitting: Internal Medicine

## 2010-03-02 ENCOUNTER — Encounter (HOSPITAL_BASED_OUTPATIENT_CLINIC_OR_DEPARTMENT_OTHER): Payer: BC Managed Care – PPO | Admitting: Internal Medicine

## 2010-03-02 ENCOUNTER — Other Ambulatory Visit: Payer: Self-pay | Admitting: Internal Medicine

## 2010-03-02 DIAGNOSIS — R141 Gas pain: Secondary | ICD-10-CM | POA: Insufficient documentation

## 2010-03-02 DIAGNOSIS — C3490 Malignant neoplasm of unspecified part of unspecified bronchus or lung: Secondary | ICD-10-CM

## 2010-03-02 DIAGNOSIS — R142 Eructation: Secondary | ICD-10-CM | POA: Insufficient documentation

## 2010-03-02 DIAGNOSIS — K7689 Other specified diseases of liver: Secondary | ICD-10-CM | POA: Insufficient documentation

## 2010-03-02 LAB — CMP (CANCER CENTER ONLY)
ALT(SGPT): 19 U/L (ref 10–47)
AST: 23 U/L (ref 11–38)
Chloride: 99 mEq/L (ref 98–108)
Creat: 0.9 mg/dl (ref 0.6–1.2)
Sodium: 139 mEq/L (ref 128–145)
Total Bilirubin: 1.1 mg/dl (ref 0.20–1.60)
Total Protein: 7.3 g/dL (ref 6.4–8.1)

## 2010-03-02 LAB — CBC WITH DIFFERENTIAL/PLATELET
BASO%: 0.6 % (ref 0.0–2.0)
Basophils Absolute: 0 10*3/uL (ref 0.0–0.1)
EOS%: 1.8 % (ref 0.0–7.0)
Eosinophils Absolute: 0.1 10*3/uL (ref 0.0–0.5)
HCT: 39.2 % (ref 34.8–46.6)
LYMPH%: 23.4 % (ref 14.0–49.7)
MCV: 88.7 fL (ref 79.5–101.0)
NEUT#: 3.2 10*3/uL (ref 1.5–6.5)
NEUT%: 68 % (ref 38.4–76.8)
Platelets: 316 10*3/uL (ref 145–400)
RBC: 4.42 10*6/uL (ref 3.70–5.45)

## 2010-03-02 MED ORDER — IOHEXOL 300 MG/ML  SOLN
100.0000 mL | Freq: Once | INTRAMUSCULAR | Status: AC | PRN
  Administered 2010-03-02: 100 mL via INTRAVENOUS

## 2010-03-05 ENCOUNTER — Encounter: Payer: Self-pay | Admitting: Internal Medicine

## 2010-03-05 ENCOUNTER — Other Ambulatory Visit: Payer: Self-pay | Admitting: Internal Medicine

## 2010-03-05 ENCOUNTER — Encounter: Payer: BC Managed Care – PPO | Admitting: Internal Medicine

## 2010-03-05 DIAGNOSIS — C3490 Malignant neoplasm of unspecified part of unspecified bronchus or lung: Secondary | ICD-10-CM

## 2010-03-12 ENCOUNTER — Encounter: Payer: Self-pay | Admitting: Internal Medicine

## 2010-03-19 LAB — CBC
Hemoglobin: 14.3 g/dL (ref 12.0–15.0)
MCH: 29.9 pg (ref 26.0–34.0)
MCV: 89.1 fL (ref 78.0–100.0)
Platelets: 311 10*3/uL (ref 150–400)
RBC: 4.79 MIL/uL (ref 3.87–5.11)
WBC: 5.4 10*3/uL (ref 4.0–10.5)

## 2010-03-19 LAB — AFB CULTURE WITH SMEAR (NOT AT ARMC): Acid Fast Smear: NONE SEEN

## 2010-03-19 LAB — CULTURE, RESPIRATORY W GRAM STAIN: Culture: NO GROWTH

## 2010-03-19 LAB — COMPREHENSIVE METABOLIC PANEL
ALT: 12 U/L (ref 0–35)
Calcium: 9.5 mg/dL (ref 8.4–10.5)
Creatinine, Ser: 1.04 mg/dL (ref 0.4–1.2)
Glucose, Bld: 91 mg/dL (ref 70–99)
Sodium: 139 mEq/L (ref 135–145)
Total Protein: 7.2 g/dL (ref 6.0–8.3)

## 2010-03-19 LAB — TISSUE CULTURE

## 2010-03-19 LAB — FUNGUS CULTURE W SMEAR: Fungal Smear: NONE SEEN

## 2010-03-19 LAB — TYPE AND SCREEN: Antibody Screen: NEGATIVE

## 2010-03-19 LAB — PROTIME-INR
INR: 0.91 (ref 0.00–1.49)
Prothrombin Time: 12.5 seconds (ref 11.6–15.2)

## 2010-03-23 LAB — TYPE AND SCREEN

## 2010-03-23 LAB — CBC
HCT: 33.9 % — ABNORMAL LOW (ref 36.0–46.0)
HCT: 37.1 % (ref 36.0–46.0)
Hemoglobin: 13.6 g/dL (ref 12.0–15.0)
MCHC: 34.1 g/dL (ref 30.0–36.0)
MCHC: 34.9 g/dL (ref 30.0–36.0)
MCV: 89.7 fL (ref 78.0–100.0)
MCV: 90 fL (ref 78.0–100.0)
MCV: 90.3 fL (ref 78.0–100.0)
MCV: 90.6 fL (ref 78.0–100.0)
Platelets: 225 10*3/uL (ref 150–400)
Platelets: 240 10*3/uL (ref 150–400)
Platelets: 287 10*3/uL (ref 150–400)
RBC: 3.69 MIL/uL — ABNORMAL LOW (ref 3.87–5.11)
RBC: 3.78 MIL/uL — ABNORMAL LOW (ref 3.87–5.11)
RBC: 4.09 MIL/uL (ref 3.87–5.11)
RBC: 4.43 MIL/uL (ref 3.87–5.11)
WBC: 6 10*3/uL (ref 4.0–10.5)
WBC: 8 10*3/uL (ref 4.0–10.5)
WBC: 8.5 10*3/uL (ref 4.0–10.5)
WBC: 9.8 10*3/uL (ref 4.0–10.5)

## 2010-03-23 LAB — COMPREHENSIVE METABOLIC PANEL
ALT: 14 U/L (ref 0–35)
BUN: 5 mg/dL — ABNORMAL LOW (ref 6–23)
CO2: 21 mEq/L (ref 19–32)
CO2: 32 mEq/L (ref 19–32)
Calcium: 8.4 mg/dL (ref 8.4–10.5)
Calcium: 9.3 mg/dL (ref 8.4–10.5)
Chloride: 103 mEq/L (ref 96–112)
Chloride: 106 mEq/L (ref 96–112)
Creatinine, Ser: 0.71 mg/dL (ref 0.4–1.2)
Creatinine, Ser: 0.89 mg/dL (ref 0.4–1.2)
GFR calc Af Amer: >60 mL/min (ref >60)
GFR calc non Af Amer: >60 mL/min (ref >60)
GFR calc non Af Amer: >60 mL/min (ref >60)
Glucose, Bld: 96 mg/dL (ref 70–99)
Sodium: 136 mEq/L (ref 135–145)
Total Bilirubin: 1 mg/dL (ref 0.3–1.2)
Total Bilirubin: 1.1 mg/dL (ref 0.3–1.2)

## 2010-03-23 LAB — PROTIME-INR: Prothrombin Time: 12.8 seconds (ref 11.6–15.2)

## 2010-03-23 LAB — POCT I-STAT 3, ART BLOOD GAS (G3+)
Bicarbonate: 26.2 mEq/L — ABNORMAL HIGH (ref 20.0–24.0)
Patient temperature: 98.1
TCO2: 28 mmol/L (ref 0–100)
pO2, Arterial: 119 mmHg — ABNORMAL HIGH (ref 80.0–100.0)

## 2010-03-23 LAB — URINALYSIS, ROUTINE W REFLEX MICROSCOPIC
Bilirubin Urine: NEGATIVE
Glucose, UA: NEGATIVE mg/dL
Glucose, UA: NEGATIVE mg/dL
Ketones, ur: NEGATIVE mg/dL
Ketones, ur: NEGATIVE mg/dL
Nitrite: NEGATIVE
Protein, ur: NEGATIVE mg/dL
Specific Gravity, Urine: 1.018 (ref 1.005–1.030)
pH: 6 (ref 5.0–8.0)
pH: 7 (ref 5.0–8.0)

## 2010-03-23 LAB — URINE MICROSCOPIC-ADD ON

## 2010-03-23 LAB — BASIC METABOLIC PANEL
BUN: 6 mg/dL (ref 6–23)
Chloride: 104 mEq/L (ref 96–112)
Chloride: 105 mEq/L (ref 96–112)
GFR calc Af Amer: >60 mL/min (ref >60)
GFR calc non Af Amer: >60 mL/min (ref >60)
GFR calc non Af Amer: >60 mL/min (ref >60)
Potassium: 3.9 mEq/L (ref 3.5–5.1)
Potassium: 4 mEq/L (ref 3.5–5.1)
Sodium: 137 mEq/L (ref 135–145)
Sodium: 138 mEq/L (ref 135–145)

## 2010-03-23 LAB — URINE CULTURE: Colony Count: 85000

## 2010-03-23 LAB — BLOOD GAS, ARTERIAL
Acid-base deficit: 0.1 mmol/L (ref 0.0–2.0)
Bicarbonate: 23.2 mEq/L (ref 20.0–24.0)
O2 Saturation: 98.5 %
pO2, Arterial: 115 mmHg — ABNORMAL HIGH (ref 80.0–100.0)

## 2010-03-23 LAB — ABO/RH: ABO/RH(D): A POS

## 2010-03-23 LAB — MRSA PCR SCREENING: MRSA by PCR: NEGATIVE

## 2010-03-24 NOTE — Letter (Signed)
Summary: [facility]  [facility]   Imported By: Sherian Rein 03/19/2010 11:45:29  _____________________________________________________________________  External Attachment:    Type:   Image     Comment:   External Document

## 2010-03-25 ENCOUNTER — Ambulatory Visit: Payer: Self-pay | Admitting: Internal Medicine

## 2010-05-19 NOTE — Letter (Signed)
January 15, 2009   Clinton D. Maple Hudson, MD, FCCP, FACP  [facility]-P[facility]  [address]  [place], [place] [address]   Re:  Dawn Campos, Dawn Campos              DOB:  [birth year]   Dear Dr. Joni Fears:   I appreciate the opportunity of seeing the patient.  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  She had a chest x-ray  and then a CT scan showed 1.7 subclavian nodes on the left upper lobe.  Her pulmonary function tests showed an FVC of 2.98 and an FEV-1 of 3.12,  a diffusion capacity of 112%.  A PET scan, which showed an SUV of 1.8  which was negative, but it was still worried that this could be a lung  cancer with a low uptake.  She does not smoke, was smoking cigarettes in  the past.  She has granulomatous disease and an exposure to pets, is an  Wellsite geologist.  She has had no fever, chills, or excessive sputum.  No  hemoptysis.   PAST MEDICAL HISTORY:   MEDICATIONS:  She is on zolpidem 10 mg daily at bedtime, Finacea gel  twice a day, and alprazolam 0.5 as needed.   ALLERGIES:  She is allergic to amoxicillin and general anesthesia causes  nausea.   FAMILY HISTORY:  Significant for arthritis, breast and skin cancer, and  lung cancer in her father.   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  She quit her cigarette for the past 5 years.  Does have occasional alcohol use.  She has a 24 year old son.  She is  married.  She is the primary Rennie Rouch as her husband has advanced  parkinsonism.   PAST SURGICAL HISTORY:  She has had a tonsillectomy, basal cell cancer  of the nose, and cesarean section.   REVIEW OF SYSTEMS:  CARDIAC:  No angina or atrial fibrillation.  PULMONARY:  No hemoptysis, fever, chills, or excessive sputum.  See  history of present illness.  GI:  No nausea, vomiting, constipation, or diarrhea.  GU:   No kidney disease, dysuria, or frequent urination.  VASCULAR:  No claudication, DVT, or TIAs.  MUSCULOSKELETAL:  She has had a rash in the past.  NEUROLOGICAL:  No headaches.  PSYCHIATRIC:  No depression or nervous.  EYE/ENT:  No changes in eyesight or hearing.  HEMATOLOGICAL:  No problems with bleeding, clotting disorders, or  anemia.   PHYSICAL EXAMINATION:  General:  She is a well-developed Caucasian  female, in no acute distress.  Vital Signs:  Her blood pressure was  142/89, pulse 79, respirations 18, and sats were 99.  Head, Eyes, Ears,  Nose, and Throat:  Unremarkable.  Neck:  Supple without thyromegaly.  There is no supraclavicular or axillary adenopathy.  Chest:  Clear to  auscultation and percussion.  Heart:  Regular sinus rhythm.  No murmurs.  Abdomen:  Soft.  There is no hepatosplenomegaly.  Extremities:  Pulses  are 2+.  There is no clubbing or edema.  Neurological:  She is oriented  x3.  Sensory and motor intact.  Cranial nerves intact.   I feel that this lesion needs to be excised using the VATS approach.  We  plan to do a wedge resection on the 24th.  If it is a cancer, then  do a  left lingulectomy with node dissection.  I appreciate the opportunity of  seeing the patient.   Sincerely,   Ines Bloomer, M.D.  Electronically Signed   DPB/MEDQ  D:  01/15/2009  T:  01/16/2009  Job:  [id number]   cc:   Corwin Levins, MD

## 2010-05-19 NOTE — Letter (Signed)
February 28, 2009   Lajuana Matte, MD  [phone]  [place], [place] [address]   Re:  Dawn Campos, SADIQ                  DOB:  Feb 17, 1955   Dear [name]:   I saw the patient back today.  My interpretations of her pathology  report is that she is EGFR positive.  I am not sure she needs to have  any further treatment since we were able to excise it, but it is  interesting that she is positive.  This would go along with her being a  nonsmoker and have adenocarcinoma.  Her blood pressure was 140/86, pulse  80, respirations 18, and sats were 98%.  Her incisions are well healed.  I have released her to return to work as of March 04, 2009.  I will see  her back again in 2 months with a chest x-ray.   Ines Bloomer, M.D.  Electronically Signed   DPB/MEDQ  D:  02/28/2009  T:  03/01/2009  Job:  [id number]   cc:   Joni Fears D. Maple Hudson, MD, FCCP, FACP  Oliver Barre, MD

## 2010-05-19 NOTE — Letter (Signed)
September 04, 2009   Lajuana Matte, MD  [phone] [address]  [place], [place] [address]   Re:  Dawn Campos, EBERLY                  DOB:  [birth year]   Dear [name],   I saw the patient in the office today and unfortunately as we discussed  at cancer conference, has positive mediastinal adenopathy with  subcarinal and left paratracheal and right paratracheal nodes.  I have  scheduled her for bronchoscopy with endobronchial ultrasound, possible  mediastinoscopy on September 10, 2009, at [facility].  I am hoping  this is not recurrent cancer and some type of an inflammatory process or  sarcoidosis.   I appreciate the opportunity of seeing the patient.   Ines Bloomer, M.D.  Electronically Signed   DPB/MEDQ  D:  09/04/2009  T:  09/05/2009  Job:  [id number]

## 2010-05-19 NOTE — Assessment & Plan Note (Signed)
OFFICE VISIT   Dawn Campos, Dawn Campos  DOB:  [birth year]                                        February 14, 2009  CHART #:  [id number]   The patient's blood pressure is 139/85, pulse 71, respirations 18, sats  were 97%.  She is worried about her anterior tube site.  I did see no  erythema.  There is some slight separation, but this is what you see  with a chest tube.  Some mild tenderness but nothing I see is of course  any active infection.  The rash she had is improved with the steroid  cream.  I will see her again in 2 weeks with a chest x-ray.   Ines Bloomer, M.D.  Electronically Signed   DPB/MEDQ  D:  02/14/2009  T:  02/15/2009  Job:  [id number]

## 2010-05-19 NOTE — Assessment & Plan Note (Signed)
OFFICE VISIT   Dawn Campos, Dawn Campos  DOB:  09-02-1955                                        April 30, 2009  CHART #:  [id number]   The patient came today and x-rays showed normal postoperative changes.  Blood pressure is 127/79, pulse 76, respirations 18, sats were 90%.  Overall, she is doing well, had a CT scan in August, and we will see her  back again at that time.   Ines Bloomer, M.D.  Electronically Signed   DPB/MEDQ  D:  04/30/2009  T:  05/01/2009  Job:  [id number]

## 2010-05-19 NOTE — Letter (Signed)
February 07, 2009   Clinton D. Maple Hudson, MD, FCCP, FACP  [facility]-P[facility]  [address]  [place], [place] [address]   Re:  Dawn Campos, VADER                  DOB:  1956-01-02   Dear Joni Fears,   I saw the patient back today after she had a VATS, lingulectomy.  Her  incision was well healed.  We removed her chest tube sutures.  She had a  rash, probably secondary to the tape, and we gave her a prescription for  Lidex with his.  Her blood pressure was 135/88, pulse 86, respirations  18, sats were 98%.  I gave her a refill of Percocet #40 and told her  that I would see her again in 3 weeks with a chest x-ray, and if she  felt like it she could return to work in 2 weeks.   Ines Bloomer, M.D.  Electronically Signed   DPB/MEDQ  D:  02/07/2009  T:  02/08/2009  Job:  [id number]

## 2010-05-19 NOTE — Letter (Signed)
October 07, 2009     Re:  SAPHIRE, BARNHART                  DOB:  [birth year]    __________  pulse 96, respirations 18, saturations were 100.  __________  I will be happy to see her at any future time.   Ines Bloomer, M.D.    DPB/MEDQ  D:  10/07/2009  T:  10/08/2009  Job:  [id number]

## 2010-05-19 NOTE — Letter (Signed)
September 16, 2009   Velora Heckler. [name], MD  [address]  [place], [place] [address]   Re:  Dawn Campos, SWETZ                  DOB:  [birth year]   Dear Dr. [name]:   I saw the patient back today and reviewed her bronchoscopy and reviewed  her mediastinoscopy results.  As you know, she had mediastinal  adenopathy that was positive on PET and the biopsies showed noncaseating  granulomatous disease consistent with sarcoid.  The cultures are  pending.  No lesions were seen.  She has seen Dr. Jetty Duhamel in the  past and will go back to see him regarding possible treatment.  Her  blood pressure was 142/97, pulse 100, respirations 18, sats were 99%.   Dawn Campos, M.D.  Electronically Signed   DPB/MEDQ  D:  09/16/2009  T:  09/17/2009  Job:  [id number]   cc:   Joni Fears D. Maple Hudson, MD, FCCP, FACP  Corwin Levins, MD

## 2010-05-22 NOTE — Letter (Signed)
October 14, 2009   Lajuana Matte, MD  [phone] [address]  [place], [place] [address]   Re:  LOVETTE, MERTA                  DOB:  [birth year]   Dear [name]:   I saw the patient back today.  Her incision is doing well.  She saw Dr.  Jetty Duhamel, who is checking an ACE level to decide about any further  treatment.  From my standpoint, she looks good, and I think you will  follow up from her lung cancer in 6 months.  Her blood pressure was  130/90, pulse 80, respirations 18, and sats were 99%.  Lungs are clear  to auscultation and percussion.   Ines Bloomer, M.D.  Electronically Signed   DPB/MEDQ  D:  10/14/2009  T:  10/15/2009  Job:  [id number]   cc:   Joni Fears D. Maple Hudson, MD, FCCP, FACP

## 2010-07-10 ENCOUNTER — Other Ambulatory Visit: Payer: Self-pay | Admitting: Obstetrics and Gynecology

## 2010-09-01 ENCOUNTER — Other Ambulatory Visit (HOSPITAL_COMMUNITY): Payer: BC Managed Care – PPO

## 2010-09-03 ENCOUNTER — Encounter (HOSPITAL_BASED_OUTPATIENT_CLINIC_OR_DEPARTMENT_OTHER): Payer: BC Managed Care – PPO | Admitting: Internal Medicine

## 2010-09-03 ENCOUNTER — Encounter (HOSPITAL_COMMUNITY): Payer: Self-pay

## 2010-09-03 ENCOUNTER — Ambulatory Visit (HOSPITAL_COMMUNITY)
Admission: RE | Admit: 2010-09-03 | Discharge: 2010-09-03 | Disposition: A | Discharge status: Home / Self Care | Payer: BC Managed Care – PPO | Source: Ambulatory Visit | Attending: Internal Medicine | Admitting: Internal Medicine

## 2010-09-03 ENCOUNTER — Other Ambulatory Visit: Payer: Self-pay | Admitting: Internal Medicine

## 2010-09-03 DIAGNOSIS — C3490 Malignant neoplasm of unspecified part of unspecified bronchus or lung: Secondary | ICD-10-CM | POA: Insufficient documentation

## 2010-09-03 DIAGNOSIS — C3410 Malignant neoplasm of upper lobe, unspecified bronchus or lung: Secondary | ICD-10-CM

## 2010-09-03 DIAGNOSIS — K7689 Other specified diseases of liver: Secondary | ICD-10-CM | POA: Insufficient documentation

## 2010-09-03 LAB — CMP (CANCER CENTER ONLY)
ALT(SGPT): 12 U/L (ref 10–47)
AST: 21 U/L (ref 11–38)
Albumin: 3.1 g/dL — ABNORMAL LOW (ref 3.3–5.5)
BUN, Bld: 18 mg/dL (ref 7–22)
CO2: 27 mEq/L (ref 18–33)
Calcium: 8.5 mg/dL (ref 8.0–10.3)
Chloride: 101 mEq/L (ref 98–108)
Potassium: 4.5 mEq/L (ref 3.3–4.7)

## 2010-09-03 LAB — CBC WITH DIFFERENTIAL/PLATELET
BASO%: 1 % (ref 0.0–2.0)
Eosinophils Absolute: 0.1 10*3/uL (ref 0.0–0.5)
HCT: 39.3 % (ref 34.8–46.6)
HGB: 13.1 g/dL (ref 11.6–15.9)
LYMPH%: 28 % (ref 14.0–49.7)
MCHC: 33.3 g/dL (ref 31.5–36.0)
MONO#: 0.3 10*3/uL (ref 0.1–0.9)
NEUT#: 2.6 10*3/uL (ref 1.5–6.5)
NEUT%: 62.2 % (ref 38.4–76.8)
Platelets: 268 10*3/uL (ref 145–400)
WBC: 4.2 10*3/uL (ref 3.9–10.3)
lymph#: 1.2 10*3/uL (ref 0.9–3.3)

## 2010-09-03 MED ORDER — IOHEXOL 300 MG/ML  SOLN
80.0000 mL | Freq: Once | INTRAMUSCULAR | Status: AC | PRN
  Administered 2010-09-03: 80 mL via INTRAVENOUS

## 2010-09-08 ENCOUNTER — Encounter (HOSPITAL_BASED_OUTPATIENT_CLINIC_OR_DEPARTMENT_OTHER): Payer: BC Managed Care – PPO | Admitting: Internal Medicine

## 2010-09-08 DIAGNOSIS — C3410 Malignant neoplasm of upper lobe, unspecified bronchus or lung: Secondary | ICD-10-CM

## 2011-01-22 ENCOUNTER — Other Ambulatory Visit: Payer: Self-pay | Admitting: Internal Medicine

## 2011-01-22 ENCOUNTER — Telehealth: Payer: Self-pay | Admitting: Internal Medicine

## 2011-01-22 DIAGNOSIS — C3490 Malignant neoplasm of unspecified part of unspecified bronchus or lung: Secondary | ICD-10-CM

## 2011-01-22 NOTE — Telephone Encounter (Signed)
lmonvm for pt re appt for 3/1 lb/ct and 3/5 fu. Schedule mailed.

## 2011-02-22 ENCOUNTER — Telehealth: Payer: Self-pay | Admitting: Internal Medicine

## 2011-02-22 NOTE — Telephone Encounter (Signed)
appt  r.s from 3/5 to 3/7 per mkm being out,l/m     aom

## 2011-03-05 ENCOUNTER — Other Ambulatory Visit (HOSPITAL_BASED_OUTPATIENT_CLINIC_OR_DEPARTMENT_OTHER): Payer: BC Managed Care – PPO

## 2011-03-05 ENCOUNTER — Ambulatory Visit (HOSPITAL_COMMUNITY)
Admission: RE | Admit: 2011-03-05 | Discharge: 2011-03-05 | Disposition: A | Discharge status: Home / Self Care | Payer: BC Managed Care – PPO | Source: Ambulatory Visit | Attending: Internal Medicine | Admitting: Internal Medicine

## 2011-03-05 DIAGNOSIS — C3410 Malignant neoplasm of upper lobe, unspecified bronchus or lung: Secondary | ICD-10-CM

## 2011-03-05 DIAGNOSIS — C3490 Malignant neoplasm of unspecified part of unspecified bronchus or lung: Secondary | ICD-10-CM | POA: Insufficient documentation

## 2011-03-05 LAB — CMP (CANCER CENTER ONLY)
ALT(SGPT): 26 U/L (ref 10–47)
AST: 22 U/L (ref 11–38)
Albumin: 4.1 g/dL (ref 3.3–5.5)
CO2: 29 mEq/L (ref 18–33)
Calcium: 9 mg/dL (ref 8.0–10.3)
Chloride: 98 mEq/L (ref 98–108)
Potassium: 4.1 mEq/L (ref 3.3–4.7)
Sodium: 139 mEq/L (ref 128–145)
Total Protein: 7.5 g/dL (ref 6.4–8.1)

## 2011-03-05 LAB — CBC WITH DIFFERENTIAL/PLATELET
BASO%: 0.4 % (ref 0.0–2.0)
EOS%: 2.1 % (ref 0.0–7.0)
HGB: 14.1 g/dL (ref 11.6–15.9)
MCH: 29.7 pg (ref 25.1–34.0)
MCHC: 33.4 g/dL (ref 31.5–36.0)
MONO#: 0.3 10*3/uL (ref 0.1–0.9)
RDW: 14.1 % (ref 11.2–14.5)
WBC: 5.7 10*3/uL (ref 3.9–10.3)
lymph#: 1.4 10*3/uL (ref 0.9–3.3)

## 2011-03-05 MED ORDER — IOHEXOL 300 MG/ML  SOLN
80.0000 mL | Freq: Once | INTRAMUSCULAR | Status: AC | PRN
  Administered 2011-03-05: 80 mL via INTRAVENOUS

## 2011-03-09 ENCOUNTER — Ambulatory Visit: Payer: BC Managed Care – PPO | Admitting: Internal Medicine

## 2011-03-11 ENCOUNTER — Ambulatory Visit (HOSPITAL_BASED_OUTPATIENT_CLINIC_OR_DEPARTMENT_OTHER): Payer: BC Managed Care – PPO | Admitting: Internal Medicine

## 2011-03-11 ENCOUNTER — Telehealth: Payer: Self-pay | Admitting: Internal Medicine

## 2011-03-11 VITALS — BP 132/90 | HR 78 | Temp 98.2°F | Ht 64.0 in | Wt 162.3 lb

## 2011-03-11 DIAGNOSIS — C3490 Malignant neoplasm of unspecified part of unspecified bronchus or lung: Secondary | ICD-10-CM

## 2011-03-11 NOTE — Telephone Encounter (Signed)
Gv pt appt for sept2013.  scheduled pt for ct on 09/04 @ [facility]

## 2011-03-14 NOTE — Progress Notes (Signed)
[facility] [phone]   [phone]  OFFICE PROGRESS NOTE  Oliver Barre, MD, MD [address] 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  This was diagnosed in January 2011.  PRIOR THERAPY:  Status post left VATs with left lingulectomy and nodal dissection under the care of Dr. Edwyna Shell on January 24, 2009.  CURRENT THERAPY:  Observation.  INTERVAL HISTORY: Dawn Campos 56 y.o. female returns to the clinic today for routine six-month followup visit. The patient has no complaints today. She denied having any significant chest pain or shortness of breath. She has no cough or hemoptysis. No significant weight loss or night sweats. She has repeat CT scan of the chest performed recently and she is here today for evaluation and discussion of her scan results.   MEDICAL HISTORY: Past Medical History  Diagnosis Date  . Sarcoidosis   . Personal history of other malignant neoplasm of skin   . Lung nodule   . Insomnia, unspecified   . Shortness of breath   . Personal history of colonic polyps   . Routine general medical examination at a health care facility   . Family history of malignant neoplasm of gastrointestinal tract   . Family history of malignant neoplasm of breast   . Alcoholism in family   . Other and unspecified hyperlipidemia   . Malignant neoplasm of bronchus and lung, unspecified site     ALLERGIES:  is allergic to amoxicillin.  MEDICATIONS:  Current Outpatient Prescriptions  Medication Sig Dispense Refill  . diphenhydrAMINE (BENADRYL) 25 MG tablet Take 25 mg by mouth every 6 (six) hours as needed.        . doxycycline (ORACEA) 40 MG capsule Take 40 mg by mouth every morning.      Marland Kitchen ibuprofen (ADVIL,MOTRIN) 200 MG tablet Take as directed on bottle when needed       . MetroNIDAZOLE (METROGEL  EX) Apply topically. Pt unsure of % dose      . zolpidem (AMBIEN) 10 MG tablet Take 10 mg by mouth at bedtime as needed.          SURGICAL HISTORY:  Past Surgical History  Procedure Date  . Cesarean section 1995  . Tonsillectomy   . Basal cell carcinoma excision     s/p basal cell cancer Moh's surgery nose  . Lung surgery 2011    Resection lung nodule- adenocarcinoma 2011- Dr Karna Dupes  . Mediastinoscopy 2011    Mediastinoscopy- noncaseating granulomas 2011    REVIEW OF SYSTEMS:  A comprehensive review of systems was negative.   PHYSICAL EXAMINATION: General appearance: alert, cooperative and no distress Neck: no adenopathy Lymph nodes: Cervical, supraclavicular, and axillary nodes normal. Resp: clear to auscultation bilaterally Cardio: regular rate and rhythm, S1, S2 normal, no murmur, click, rub or gallop GI: soft, non-tender; bowel sounds normal; no masses,  no organomegaly Extremities: extremities normal, atraumatic, no cyanosis or edema Neurologic: Alert and oriented X 3, normal strength and tone. Normal symmetric reflexes. Normal coordination and gait  ECOG PERFORMANCE STATUS: 1 - Symptomatic but completely ambulatory  Blood pressure 132/90, pulse 78, temperature 98.2 F (36.8 C), temperature source Oral, height 5\' 4"  (1.626 m), weight 162 lb 4.8 oz (73.619 kg).  LABORATORY DATA: Lab Results  Component Value Date   WBC 5.7 03/05/2011   HGB 14.1  03/05/2011   HCT 42.1 03/05/2011   MCV 88.9 03/05/2011   PLT 297 03/05/2011      Chemistry      Component Value Date/Time   NA 139 03/05/2011 1232   NA 139 09/09/2009 1331   K 4.1 03/05/2011 1232   K 4.8 09/09/2009 1331   CL 98 03/05/2011 1232   CL 107 09/09/2009 1331   CO2 29 03/05/2011 1232   CO2 26 09/09/2009 1331   BUN 14 03/05/2011 1232   BUN 14 09/09/2009 1331   CREATININE 1.0 03/05/2011 1232   CREATININE 1.04 09/09/2009 1331      Component Value Date/Time   CALCIUM 9.0 03/05/2011 1232   CALCIUM 9.5 09/09/2009 1331   ALKPHOS 61  03/05/2011 1232   ALKPHOS 59 09/09/2009 1331   AST 22 03/05/2011 1232   AST 19 09/09/2009 1331   ALT 12 09/09/2009 1331   BILITOT 1.20 03/05/2011 1232   BILITOT 1.0 09/09/2009 1331       RADIOGRAPHIC STUDIES: Ct Chest W Contrast  03/05/2011  *RADIOLOGY REPORT*  Clinical Data: Lung cancer diagnosed 2010, status post resection  CT CHEST WITH CONTRAST  Technique:  Multidetector CT imaging of the chest was performed following the standard protocol during bolus administration of intravenous contrast.  Contrast: 80mL OMNIPAQUE IOHEXOL 300 MG/ML IJ SOLN  Comparison: 09/03/2010  Findings: Status post partial left upper lobe wedge resection.  Stable 6 mm patchy/ground-glass opacity in the left lower lobe (series 5/image 40).  No new/suspicious pulmonary nodules. No pleural effusion or pneumothorax.  Visualized thyroid is unremarkable.  The heart is normal in size.  No pericardial effusion.  No suspicious mediastinal, hilar, or axillary lymphadenopathy.  Visualized upper abdomen is notable for a suspected a flash filling hemangioma in the right hepatic dome (series 2/image 50) and additional probable hemangioma in the posterior segment right hepatic lobe (series 2/image 52).  Very mild degenerative changes of the lower thoracic spine.  IMPRESSION: Status post partial left upper lobe wedge resection.  No evidence of recurrent or metastatic disease in the chest.  Stable 6 mm patchy/ground-glass opacity in the left lower lobe.  Original Report Authenticated By: Charline Bills, M.D.    ASSESSMENT: This is a very pleasant 56 years old white female with a history of stage IA non-small cell lung cancer status post left lingulectomy and lymph node dissection in January of 2011. The patient is doing fine and she has no evidence for disease recurrence on recent scan.  PLAN: I discussed the scan results with the patient and recommended for her continuous observation for now with repeat CT scan of the chest in 6 months. She was  advised to call me immediately if she has any concerning symptoms in the interval.  All questions were answered. The patient knows to call the clinic with any problems, questions or concerns. We can certainly see the patient much sooner if necessary.

## 2011-08-16 IMAGING — CR DG CHEST 2V
2 series · 2 of 2 positions shown · non-contrast
Comparison: None

CLINICAL DATA: Shortness of breath, cough.

CHEST - 2 VIEW

[view not recorded (1 of 2)]
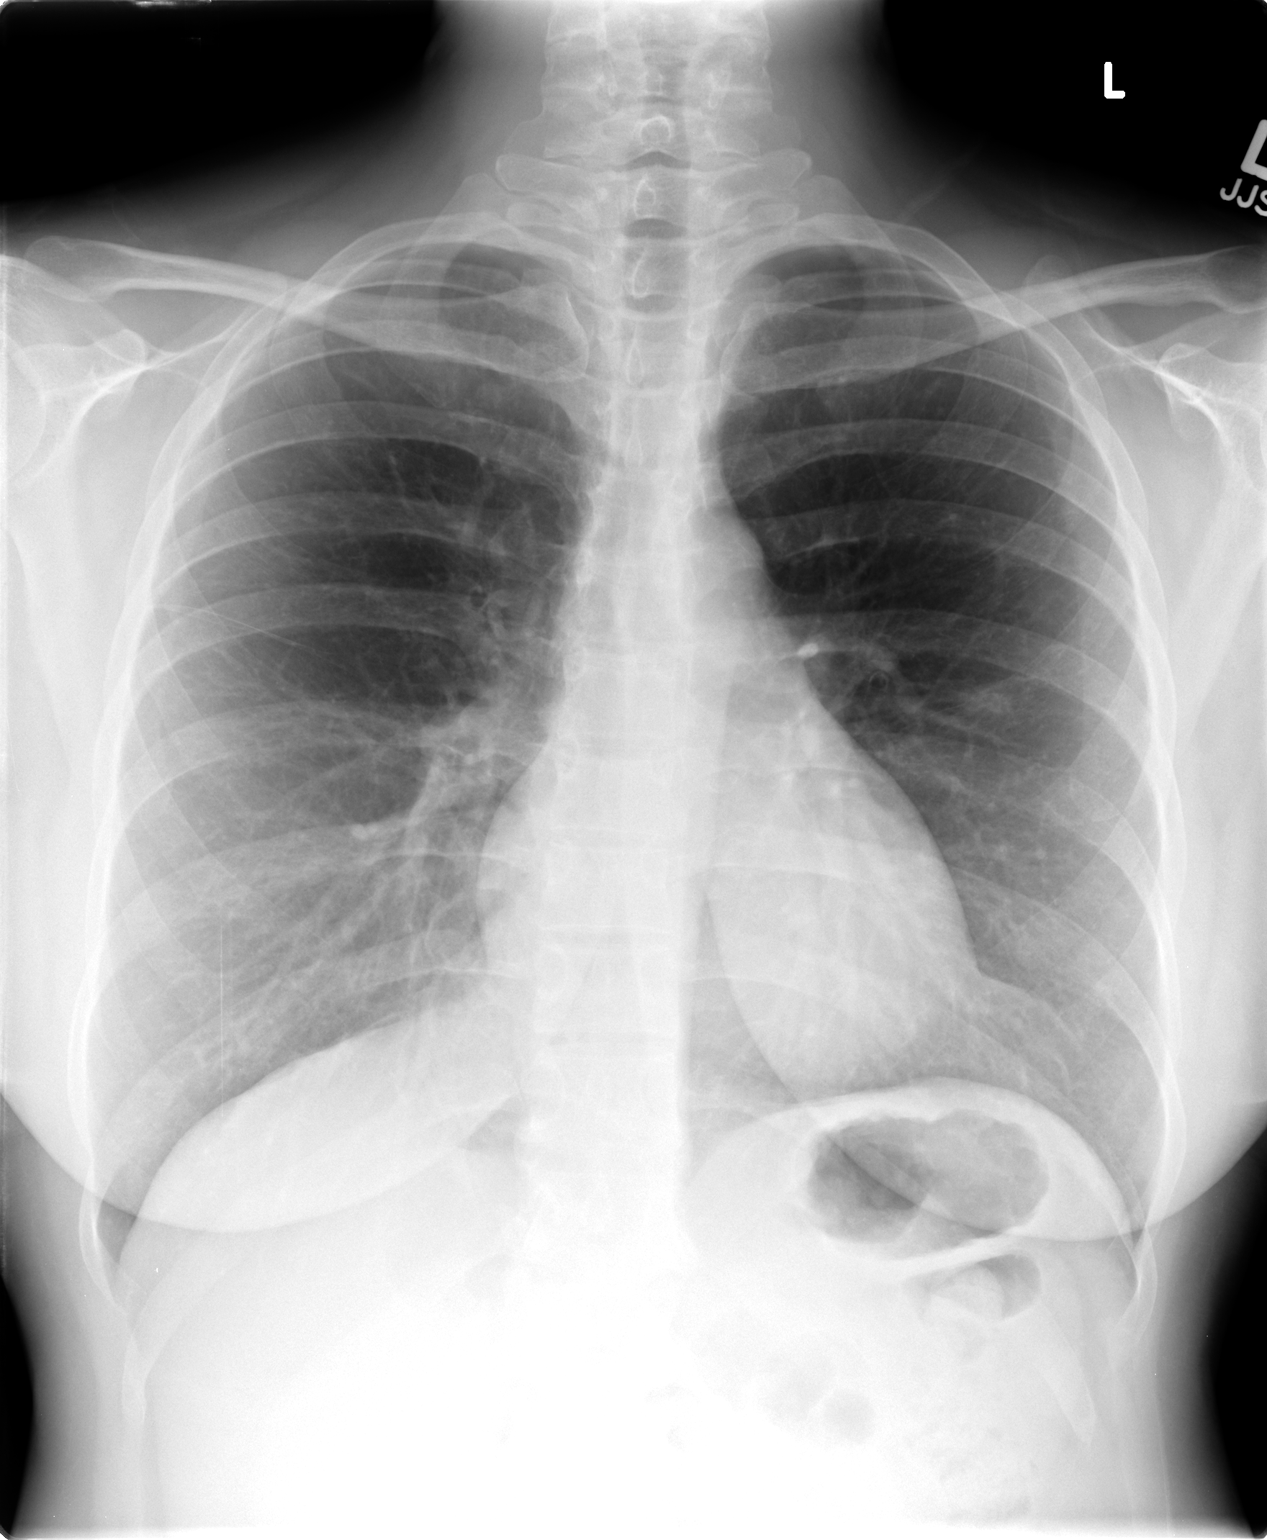

[view not recorded (2 of 2)]
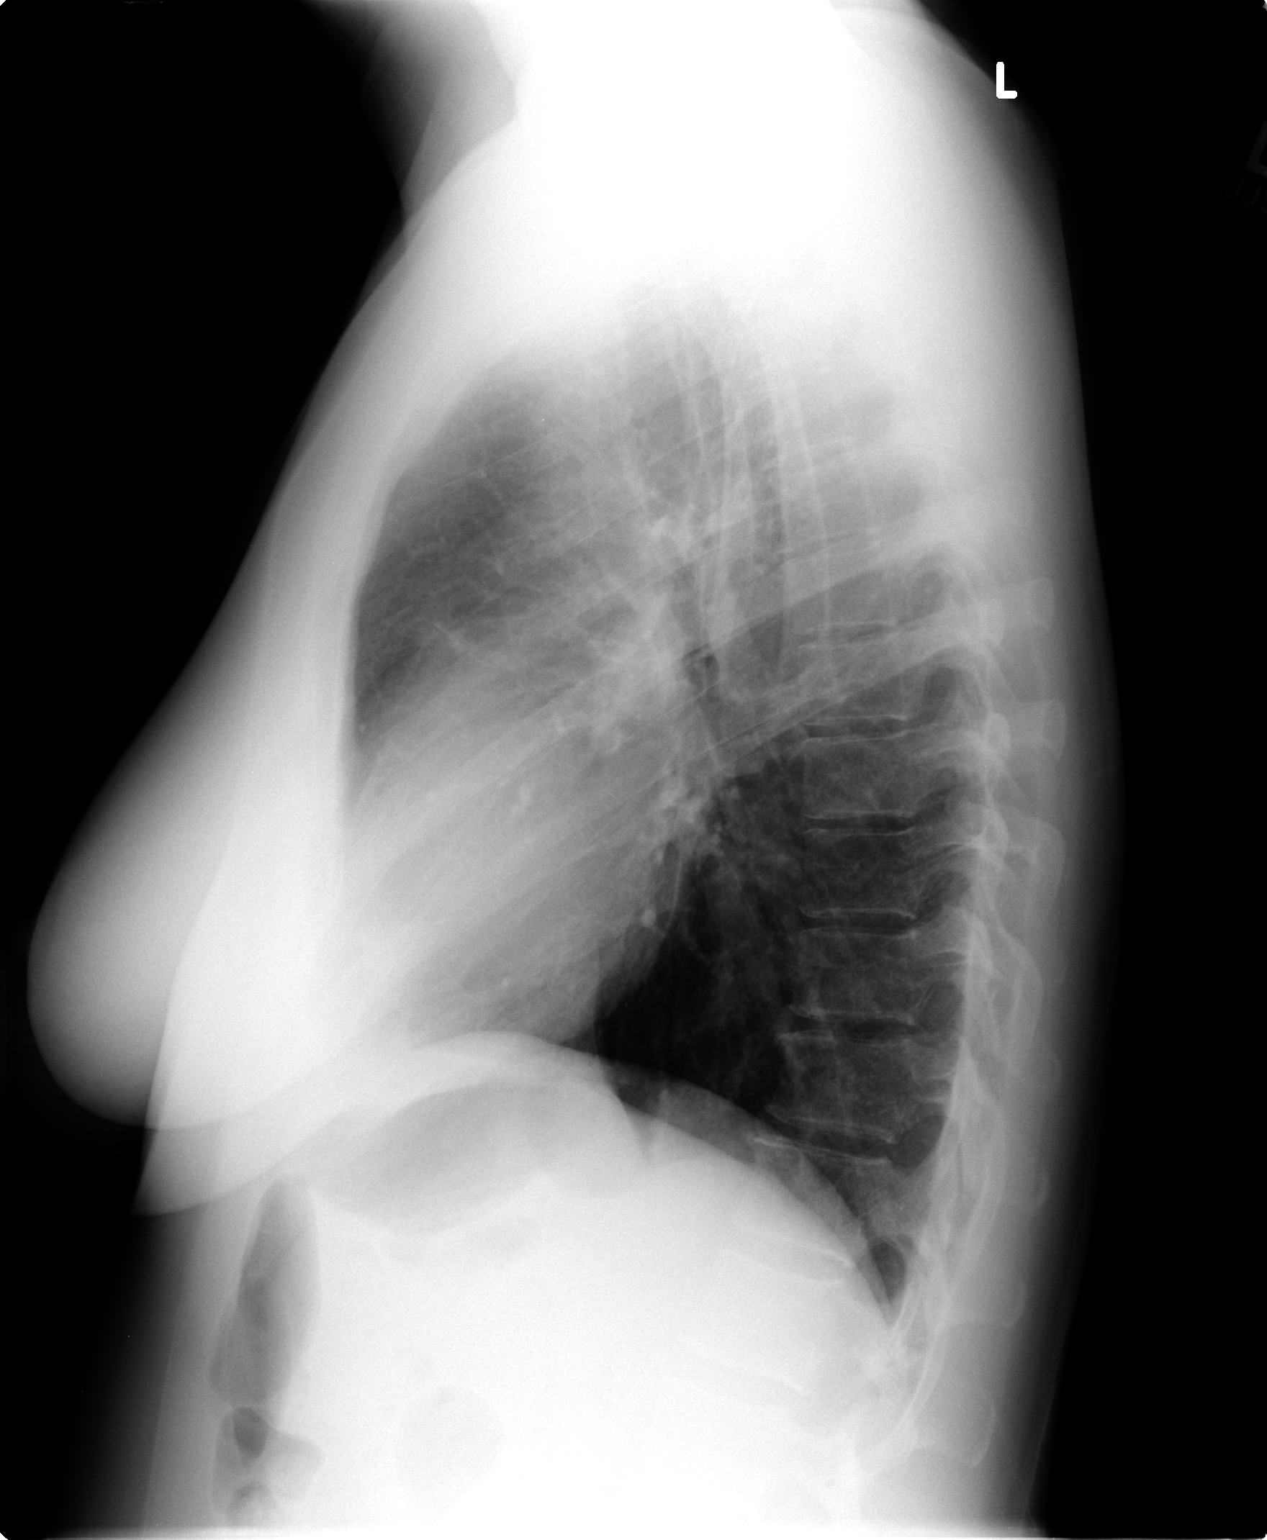

[2 of 2 positions shown; findings below may reference images not displayed]

FINDINGS: There is a nodular density seen within the left mid lung,
likely left upper lobe.  Recommend chest CT for further evaluation.
Heart is normal size.  Right lung clear.  No effusions.  No acute
bony abnormality.
IMPRESSION: Nodular density projects over the left midlung.  Recommend chest CT
for further evaluation.

## 2011-08-25 IMAGING — CT CT CHEST W/ CM
2 of 3 series · 15 of 36 positions shown, 18 images · IV contrast (Omnipaque 300)
Comparison: 12/24/2008

CLINICAL DATA: Pulmonary nodule in the left upper lobe.  Chest
pain.  Dry cough.

CT CHEST WITH CONTRAST
TECHNIQUE: Multidetector CT imaging of the chest was performed
following the standard protocol during bolus administration of
intravenous contrast.
Contrast: 80 ml [id number]

[Series 3: chest routine with · axial · 0.63mm/px · z∈[-284,-10]mm · 12 of 65 slices shown, 15 images]
[im 5/65  mediastinal]
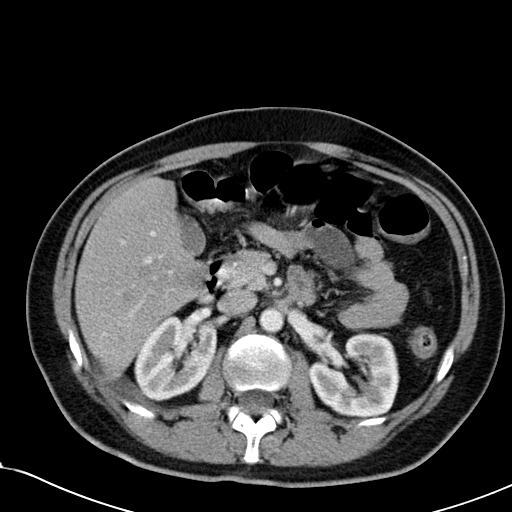
[im 5/65  lung]
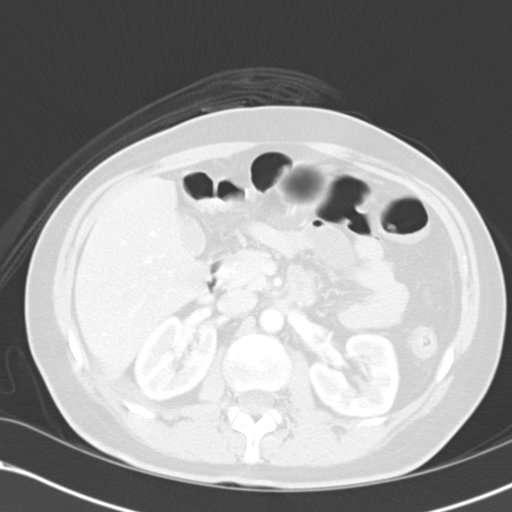
[im 10/65  lung]
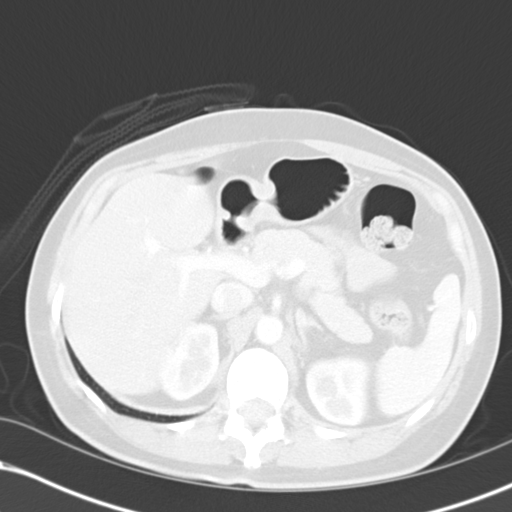
[im 15/65  lung]
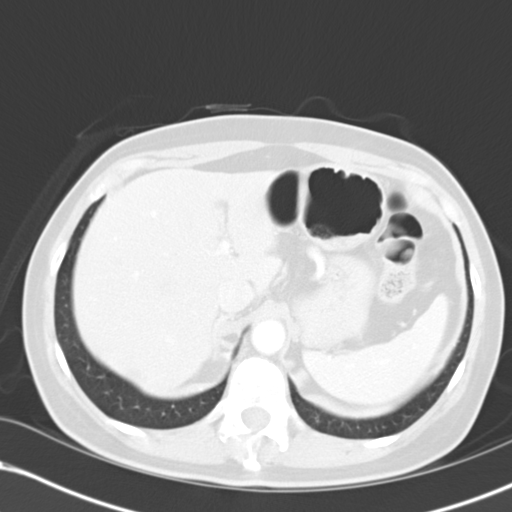
[im 19/65  lung]
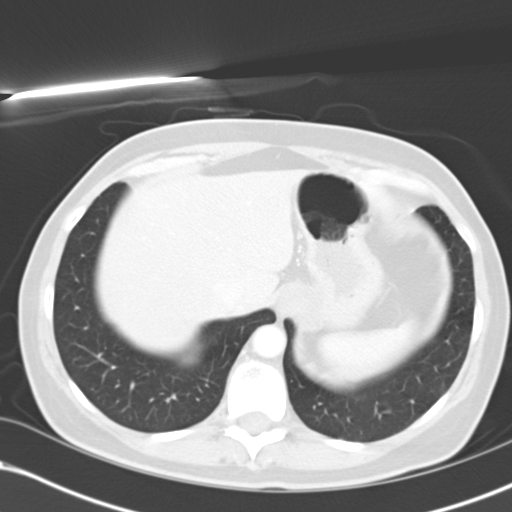
[im 24/65  mediastinal]
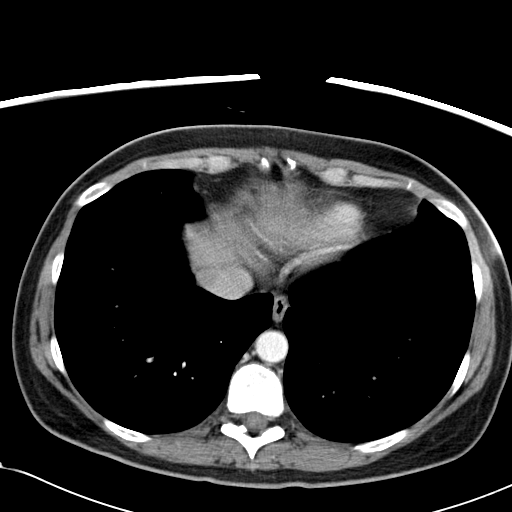
[im 24/65  lung]
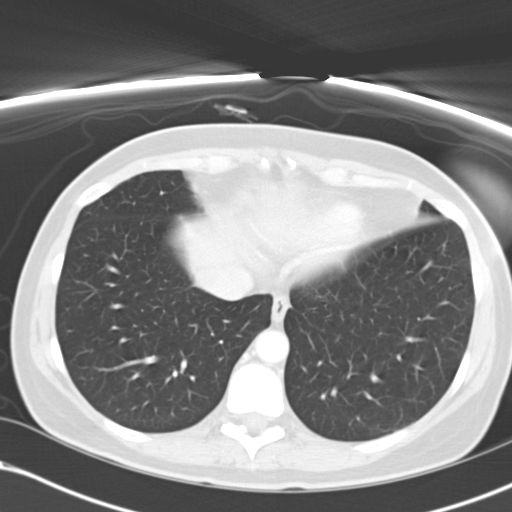
[im 29/65  lung]
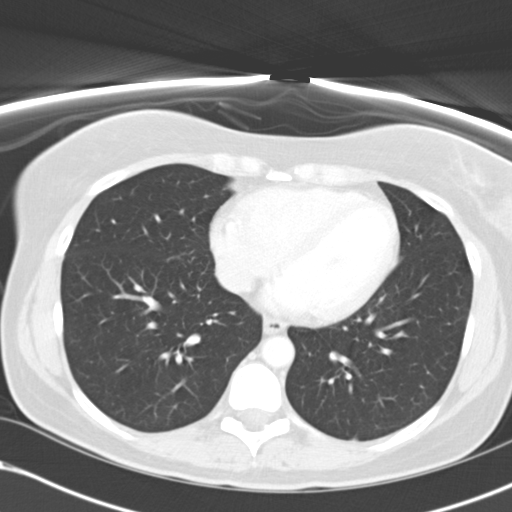
[im 36/65  lung]
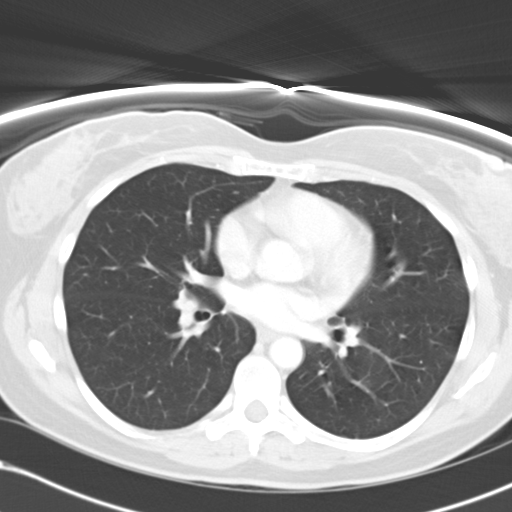
[im 41/65  lung]
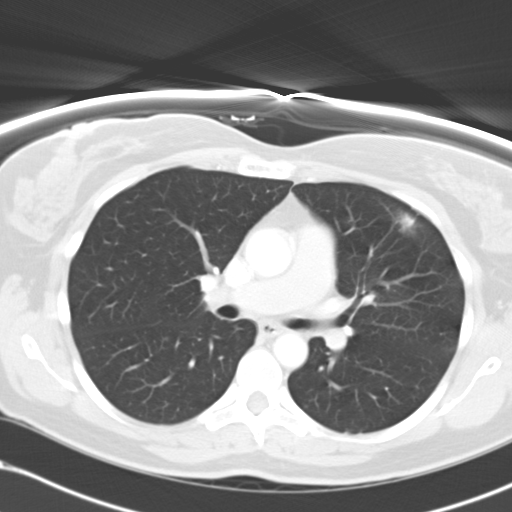
[im 46/65  mediastinal]
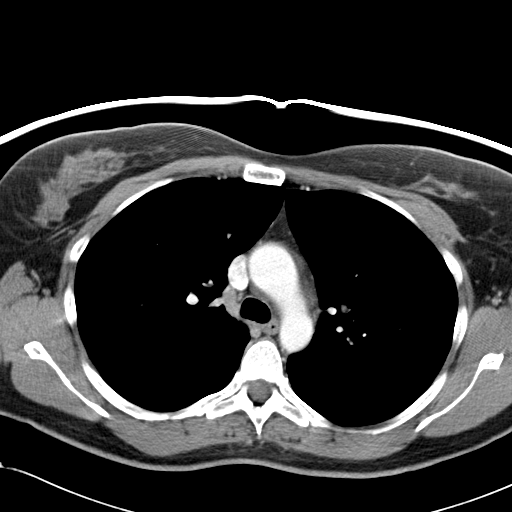
[im 46/65  lung]
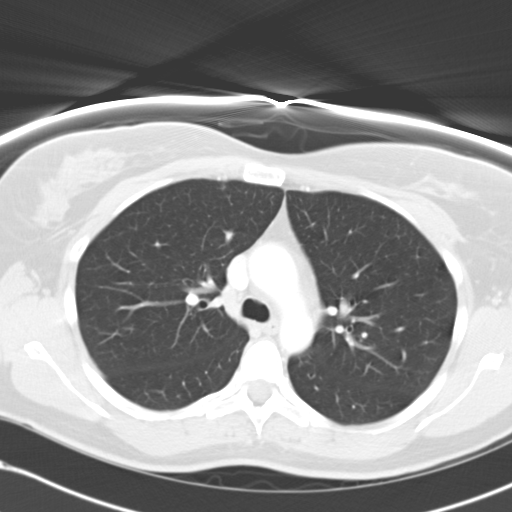
[im 50/65  lung]
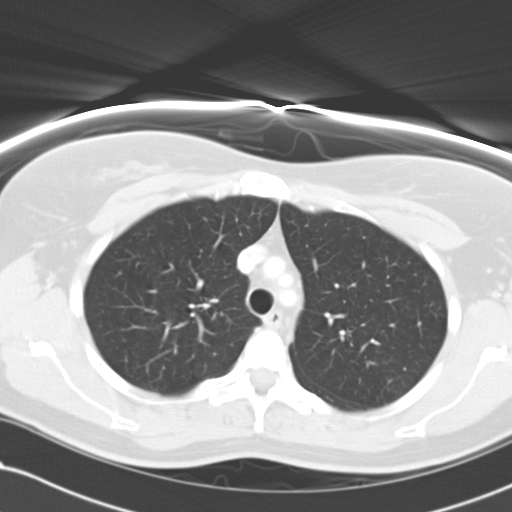
[im 55/65  lung]
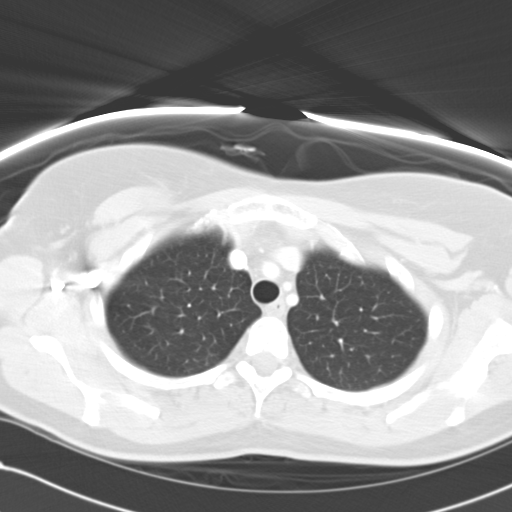
[im 60/65  lung]
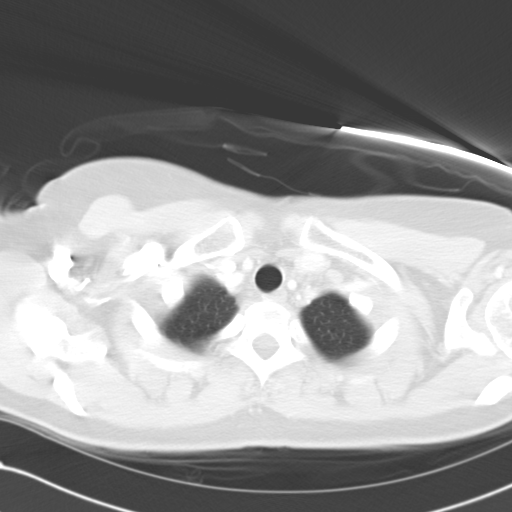

[Series 602: <mpr range> · coronal · 0.65mm/px · 3 of 112 slices shown]
[im 23/112  lung]
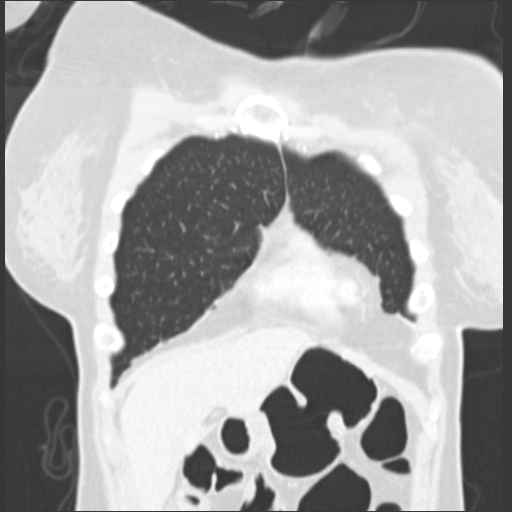
[im 45/112  lung]
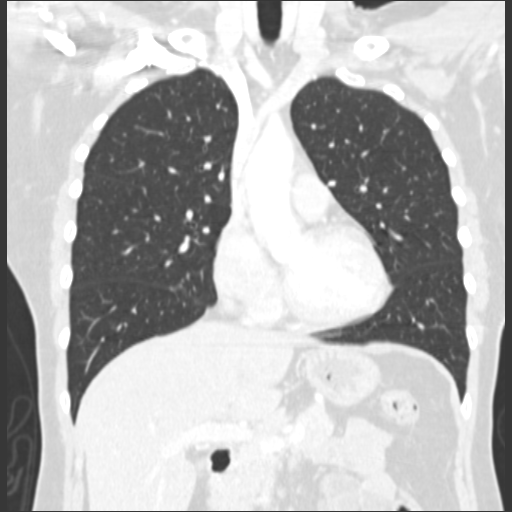
[im 67/112  lung]
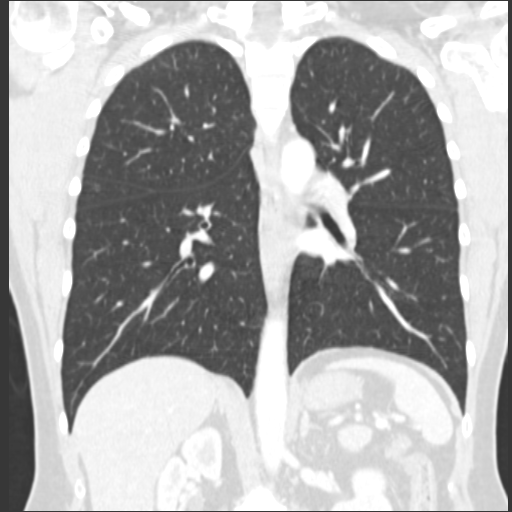

[15 of 36 positions shown; findings below may reference images not displayed]

FINDINGS: The left upper lobe nodular density seen at chest
radiography is a spiculated lesion anteriorly in the left upper
lobe, measuring 1.7 x 1.3 x 0.9 cm.  The appearance is suspicious
for bronchogenic carcinoma, with an inflammatory focus considered
less likely given the lack of significant change over the past 9
days.

No pathologically enlarged thoracic adenopathy is identified.  The
no adrenal mass identified.
IMPRESSION: 1.  Left upper lobe spiculated nodule is suspicious for
bronchogenic carcinoma.  Consider PET CT or resection.

## 2011-09-08 ENCOUNTER — Other Ambulatory Visit (HOSPITAL_BASED_OUTPATIENT_CLINIC_OR_DEPARTMENT_OTHER): Payer: BC Managed Care – PPO | Admitting: Lab

## 2011-09-08 ENCOUNTER — Ambulatory Visit (HOSPITAL_COMMUNITY)
Admission: RE | Admit: 2011-09-08 | Discharge: 2011-09-08 | Disposition: A | Discharge status: Home / Self Care | Payer: BC Managed Care – PPO | Source: Ambulatory Visit | Attending: Internal Medicine | Admitting: Internal Medicine

## 2011-09-08 DIAGNOSIS — C3490 Malignant neoplasm of unspecified part of unspecified bronchus or lung: Secondary | ICD-10-CM

## 2011-09-08 DIAGNOSIS — K7689 Other specified diseases of liver: Secondary | ICD-10-CM | POA: Insufficient documentation

## 2011-09-08 DIAGNOSIS — M549 Dorsalgia, unspecified: Secondary | ICD-10-CM | POA: Insufficient documentation

## 2011-09-08 DIAGNOSIS — C3410 Malignant neoplasm of upper lobe, unspecified bronchus or lung: Secondary | ICD-10-CM

## 2011-09-08 LAB — CBC WITH DIFFERENTIAL/PLATELET
BASO%: 0.5 % (ref 0.0–2.0)
HCT: 40.4 % (ref 34.8–46.6)
LYMPH%: 27.9 % (ref 14.0–49.7)
MCHC: 33.2 g/dL (ref 31.5–36.0)
MCV: 88.4 fL (ref 79.5–101.0)
MONO#: 0.4 10*3/uL (ref 0.1–0.9)
NEUT%: 64 % (ref 38.4–76.8)
Platelets: 276 10*3/uL (ref 145–400)
WBC: 6.6 10*3/uL (ref 3.9–10.3)

## 2011-09-08 LAB — COMPREHENSIVE METABOLIC PANEL (CC13)
ALT: 21 U/L (ref 0–55)
AST: 16 U/L (ref 5–34)
Alkaline Phosphatase: 55 U/L (ref 40–150)
CO2: 26 mEq/L (ref 22–29)
Creatinine: 1.1 mg/dL (ref 0.6–1.1)
Total Bilirubin: 1 mg/dL (ref 0.20–1.20)

## 2011-09-08 MED ORDER — IOHEXOL 300 MG/ML  SOLN
80.0000 mL | Freq: Once | INTRAMUSCULAR | Status: AC | PRN
  Administered 2011-09-08: 80 mL via INTRAVENOUS

## 2011-09-13 ENCOUNTER — Ambulatory Visit (HOSPITAL_BASED_OUTPATIENT_CLINIC_OR_DEPARTMENT_OTHER): Payer: BC Managed Care – PPO | Admitting: Internal Medicine

## 2011-09-13 ENCOUNTER — Telehealth: Payer: Self-pay | Admitting: Internal Medicine

## 2011-09-13 VITALS — BP 121/83 | HR 82 | Temp 97.6°F | Resp 18 | Ht 64.0 in | Wt 159.2 lb

## 2011-09-13 DIAGNOSIS — C3410 Malignant neoplasm of upper lobe, unspecified bronchus or lung: Secondary | ICD-10-CM

## 2011-09-13 DIAGNOSIS — C3490 Malignant neoplasm of unspecified part of unspecified bronchus or lung: Secondary | ICD-10-CM

## 2011-09-13 NOTE — Patient Instructions (Signed)
Your scan is fine today Followup in 1 year with repeat CT scan of the chest

## 2011-09-13 NOTE — Telephone Encounter (Signed)
appts made and ptrinted for pt aom °

## 2011-09-13 NOTE — Progress Notes (Signed)
[facility] Telephone:(336) [phone]   Fax:(336) [phone]  OFFICE PROGRESS NOTE  Oliver Barre, MD [address] [facility] 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 This was diagnosed in January 2011.   PRIOR THERAPY: Status post left VATs with left lingulectomy and nodal dissection under the care of Dr. Edwyna Shell on January 24, 2009.   CURRENT THERAPY: Observation.  INTERVAL HISTORY: Dawn Campos 56 y.o. female returns to the clinic today for six-month followup visit with no significant complaints. The patient denied having any significant chest pain, shortness breath, cough or hemoptysis. She denied having any significant weight loss or night sweats. She has repeat CT scan of the chest performed recently and she is here for evaluation and discussion of her scan results.  MEDICAL HISTORY: Past Medical History  Diagnosis Date  . Sarcoidosis   . Personal history of other malignant neoplasm of skin   . Lung nodule   . Insomnia, unspecified   . Shortness of breath   . Personal history of colonic polyps   . Routine general medical examination at a health care facility   . Family history of malignant neoplasm of gastrointestinal tract   . Family history of malignant neoplasm of breast   . Alcoholism in family   . Other and unspecified hyperlipidemia   . Malignant neoplasm of bronchus and lung, unspecified site     ALLERGIES:  is allergic to amoxicillin.  MEDICATIONS:  Current Outpatient Prescriptions  Medication Sig Dispense Refill  . doxycycline (ORACEA) 40 MG capsule Take 40 mg by mouth every morning.      . diphenhydrAMINE (BENADRYL) 25 MG tablet Take 25 mg by mouth every 6 (six) hours as needed.        Marland Kitchen ibuprofen (ADVIL,MOTRIN) 200 MG tablet Take as directed on bottle when needed       . zolpidem (AMBIEN) 10 MG tablet Take 10  mg by mouth at bedtime as needed.          SURGICAL HISTORY:  Past Surgical History  Procedure Date  . Cesarean section 1995  . Tonsillectomy   . Basal cell carcinoma excision     s/p basal cell cancer Moh's surgery nose  . Lung surgery 2011    Resection lung nodule- adenocarcinoma 2011- Dr Karna Dupes  . Mediastinoscopy 2011    Mediastinoscopy- noncaseating granulomas 2011    REVIEW OF SYSTEMS:  A comprehensive review of systems was negative.   PHYSICAL EXAMINATION: General appearance: alert, cooperative and no distress Neck: no adenopathy Lymph nodes: Cervical, supraclavicular, and axillary nodes normal. Resp: clear to auscultation bilaterally Cardio: regular rate and rhythm, S1, S2 normal, no murmur, click, rub or gallop GI: soft, non-tender; bowel sounds normal; no masses,  no organomegaly Extremities: extremities normal, atraumatic, no cyanosis or edema Neurologic: Alert and oriented X 3, normal strength and tone. Normal symmetric reflexes. Normal coordination and gait  ECOG PERFORMANCE STATUS: 0 - Asymptomatic  Blood pressure 121/83, pulse 82, temperature 97.6 F (36.4 C), temperature source Oral, resp. rate 18, height 5\' 4"  (1.626 m), weight 159 lb 3.2 oz (72.213 kg).  LABORATORY DATA: Lab Results  Component Value Date   WBC 6.6 09/08/2011   HGB 13.4 09/08/2011   HCT 40.4 09/08/2011   MCV 88.4 09/08/2011   PLT 276 09/08/2011      Chemistry  Component Value Date/Time   NA 137 09/08/2011 0813   NA 139 03/05/2011 1232   NA 139 09/09/2009 1331   K 4.3 09/08/2011 0813   K 4.1 03/05/2011 1232   K 4.8 09/09/2009 1331   CL 103 09/08/2011 0813   CL 98 03/05/2011 1232   CL 107 09/09/2009 1331   CO2 26 09/08/2011 0813   CO2 29 03/05/2011 1232   CO2 26 09/09/2009 1331   BUN 13.0 09/08/2011 0813   BUN 14 03/05/2011 1232   BUN 14 09/09/2009 1331   CREATININE 1.1 09/08/2011 0813   CREATININE 1.0 03/05/2011 1232   CREATININE 1.04 09/09/2009 1331      Component Value Date/Time   CALCIUM 9.0  09/08/2011 0813   CALCIUM 9.0 03/05/2011 1232   CALCIUM 9.5 09/09/2009 1331   ALKPHOS 55 09/08/2011 0813   ALKPHOS 61 03/05/2011 1232   ALKPHOS 59 09/09/2009 1331   AST 16 09/08/2011 0813   AST 22 03/05/2011 1232   AST 19 09/09/2009 1331   ALT 21 09/08/2011 0813   ALT 12 09/09/2009 1331   BILITOT 1.00 09/08/2011 0813   BILITOT 1.20 03/05/2011 1232   BILITOT 1.0 09/09/2009 1331       RADIOGRAPHIC STUDIES: Ct Chest W Contrast  09/08/2011  *RADIOLOGY REPORT*  Clinical Data: Follow-up lung cancer.  Back pain.  CT CHEST WITH CONTRAST  Technique:  Multidetector CT imaging of the chest was performed following the standard protocol during bolus administration of intravenous contrast.  Contrast: 80mL OMNIPAQUE IOHEXOL 300 MG/ML  SOLN  Comparison: 03/05/2011.  Findings: No pathologically enlarged mediastinal, hilar or axillary lymph nodes.  Heart size normal.  No pericardial effusion.  Probable subpleural atelectasis in the right lower lobe, dependently.  Postoperative changes in the left upper lobe, with associated scarring. 6 mm nodular density in the left lower lobe (image 38) is stable.  Lungs are otherwise clear.  No pleural fluid.  Airway is unremarkable.  Incidental imaging of the upper abdomen shows a vague area of hyper attenuation in the dome, as before, stable (image 50).  A peripheral low attenuation lesion in the right hepatic lobe measures 10 mm, stable (image 52).  Adrenal glands are unremarkable.  No worrisome lytic or sclerotic lesions.  IMPRESSION:  1.  Postoperative changes in the left upper lobe without evidence of recurrent or metastatic disease. 2.  6 mm nodular density left lower lobe is stable. 3.  High and low attenuation lesions in the right hepatic lobe, stable.   Original Report Authenticated By: Reyes Ivan, M.D.     ASSESSMENT: This is a very pleasant 56 years old white female with history of stage IA non-small cell lung cancer diagnosed in June of 2011 is status post left lingulectomy with lymph  node dissection and has been observation since that time was no evidence for disease recurrence.  PLAN: I discussed the scan results with the patient and recommended for her continuous observation for now. I would see her back for followup visit in one year with repeat CT scan of the chest with contrast. She was advised to call me immediately if she has any concerning symptoms in the interval.  All questions were answered. The patient knows to call the clinic with any problems, questions or concerns. We can certainly see the patient much sooner if necessary.

## 2012-07-26 ENCOUNTER — Other Ambulatory Visit: Payer: Self-pay | Admitting: Obstetrics and Gynecology

## 2012-09-11 ENCOUNTER — Other Ambulatory Visit (HOSPITAL_BASED_OUTPATIENT_CLINIC_OR_DEPARTMENT_OTHER): Payer: BC Managed Care – PPO

## 2012-09-11 ENCOUNTER — Ambulatory Visit (HOSPITAL_COMMUNITY)
Admission: RE | Admit: 2012-09-11 | Discharge: 2012-09-11 | Disposition: A | Discharge status: Home / Self Care | Payer: BC Managed Care – PPO | Source: Ambulatory Visit | Attending: Internal Medicine | Admitting: Internal Medicine

## 2012-09-11 DIAGNOSIS — C3490 Malignant neoplasm of unspecified part of unspecified bronchus or lung: Secondary | ICD-10-CM

## 2012-09-11 DIAGNOSIS — C3410 Malignant neoplasm of upper lobe, unspecified bronchus or lung: Secondary | ICD-10-CM

## 2012-09-11 DIAGNOSIS — K7689 Other specified diseases of liver: Secondary | ICD-10-CM | POA: Insufficient documentation

## 2012-09-11 DIAGNOSIS — R911 Solitary pulmonary nodule: Secondary | ICD-10-CM | POA: Insufficient documentation

## 2012-09-11 LAB — CBC WITH DIFFERENTIAL/PLATELET
BASO%: 1.2 % (ref 0.0–2.0)
EOS%: 2.5 % (ref 0.0–7.0)
HCT: 40.1 % (ref 34.8–46.6)
LYMPH%: 33.8 % (ref 14.0–49.7)
MCH: 30 pg (ref 25.1–34.0)
MCHC: 33.7 g/dL (ref 31.5–36.0)
NEUT%: 54.2 % (ref 38.4–76.8)
Platelets: 273 10*3/uL (ref 145–400)
lymph#: 1.4 10*3/uL (ref 0.9–3.3)

## 2012-09-11 LAB — COMPREHENSIVE METABOLIC PANEL (CC13)
ALT: 25 U/L (ref 0–55)
AST: 23 U/L (ref 5–34)
Alkaline Phosphatase: 54 U/L (ref 40–150)
Creatinine: 1 mg/dL (ref 0.6–1.1)
Total Bilirubin: 1.28 mg/dL — ABNORMAL HIGH (ref 0.20–1.20)

## 2012-09-11 MED ORDER — IOHEXOL 300 MG/ML  SOLN
80.0000 mL | Freq: Once | INTRAMUSCULAR | Status: AC | PRN
  Administered 2012-09-11: 80 mL via INTRAVENOUS

## 2012-09-14 ENCOUNTER — Telehealth: Payer: Self-pay | Admitting: Internal Medicine

## 2012-09-14 ENCOUNTER — Encounter: Payer: Self-pay | Admitting: Internal Medicine

## 2012-09-14 ENCOUNTER — Ambulatory Visit (HOSPITAL_BASED_OUTPATIENT_CLINIC_OR_DEPARTMENT_OTHER): Payer: BC Managed Care – PPO | Admitting: Internal Medicine

## 2012-09-14 VITALS — BP 108/73 | HR 72 | Temp 97.2°F | Resp 20 | Ht 64.0 in | Wt 136.1 lb

## 2012-09-14 DIAGNOSIS — C3490 Malignant neoplasm of unspecified part of unspecified bronchus or lung: Secondary | ICD-10-CM

## 2012-09-14 NOTE — Telephone Encounter (Signed)
gave pt appt for lab and MD on September 2015 °

## 2012-09-14 NOTE — Progress Notes (Signed)
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 This was diagnosed in January 2011.   PRIOR THERAPY: Status post left VATs with left lingulectomy and nodal dissection under the care of Dr. Edwyna Shell on January 24, 2009.   CURRENT THERAPY: Observation.  CHEMOTHERAPY INTENT: Curative  CURRENT # OF CHEMOTHERAPY CYCLES: None  CURRENT ANTIEMETICS: None  CURRENT SMOKING STATUS: Former smoker  ORAL CHEMOTHERAPY AND CONSENT: None  CURRENT BISPHOSPHONATES USE: None  PAIN MANAGEMENT: 0/10  NARCOTICS INDUCED CONSTIPATION: None  LIVING WILL AND CODE STATUS: No CODE BLUE   INTERVAL HISTORY: Dawn Campos 57 y.o. female returns to the clinic today for annual followup visit. The patient is feeling fine today with no specific complaints. She denied having any significant chest pain, shortness breath, cough or hemoptysis. She intentionally lost more than 15 pounds recently. The patient denied having any night sweats. She has no nausea or vomiting. She had repeat CT scan of the chest performed recently and she is here for evaluation and discussion of her scan results.  MEDICAL HISTORY: Past Medical History  Diagnosis Date  . Sarcoidosis   . Personal history of other malignant neoplasm of skin   . Lung nodule   . Insomnia, unspecified   . Shortness of breath   . Personal history of colonic polyps   . Routine general medical examination at a health care facility   . Family history of malignant neoplasm of gastrointestinal tract   . Family history of malignant neoplasm of breast   . Alcoholism in family   . Other and unspecified hyperlipidemia   . Malignant neoplasm of bronchus and lung, unspecified site     ALLERGIES:  is allergic to  amoxicillin.  MEDICATIONS:  Current Outpatient Prescriptions  Medication Sig Dispense Refill  . ibuprofen (ADVIL,MOTRIN) 200 MG tablet Take as directed on bottle when needed       . polyethylene glycol (MIRALAX / GLYCOLAX) packet Take 17 g by mouth daily.      . diphenhydrAMINE (BENADRYL) 25 MG tablet Take 25 mg by mouth every 6 (six) hours as needed.        . doxycycline (ORACEA) 40 MG capsule Take 40 mg by mouth every morning.       No current facility-administered medications for this visit.    SURGICAL HISTORY:  Past Surgical History  Procedure Laterality Date  . Cesarean section  1995  . Tonsillectomy    . Basal cell carcinoma excision      s/p basal cell cancer Moh's surgery nose  . Lung surgery  2011    Resection lung nodule- adenocarcinoma 2011- Dr Karna Dupes  . Mediastinoscopy  2011    Mediastinoscopy- noncaseating granulomas 2011    REVIEW OF SYSTEMS:  A comprehensive review of systems was negative.   PHYSICAL EXAMINATION: General appearance: alert, cooperative and no distress Head: Normocephalic, without obvious abnormality, atraumatic Neck: no adenopathy Lymph nodes: Cervical, supraclavicular, and axillary nodes normal. Resp: clear to auscultation bilaterally Cardio: regular rate and rhythm, S1, S2 normal, no murmur, click, rub or gallop GI: soft, non-tender; bowel sounds normal; no masses,  no organomegaly Extremities: extremities normal, atraumatic, no cyanosis or edema  ECOG PERFORMANCE STATUS: 0 - Asymptomatic  Blood pressure 108/73, pulse 72, temperature 97.2 F (  36.2 C), temperature source Oral, resp. rate 20, height 5\' 4"  (1.626 m), weight 136 lb 1.6 oz (61.735 kg).  LABORATORY DATA: Lab Results  Component Value Date   WBC 4.1 09/11/2012   HGB 13.5 09/11/2012   HCT 40.1 09/11/2012   MCV 89.1 09/11/2012   PLT 273 09/11/2012      Chemistry      Component Value Date/Time   NA 141 09/11/2012 0910   NA 139 03/05/2011 1232   NA 139 09/09/2009 1331   K 4.1  09/11/2012 0910   K 4.1 03/05/2011 1232   K 4.8 09/09/2009 1331   CL 103 09/08/2011 0813   CL 98 03/05/2011 1232   CL 107 09/09/2009 1331   CO2 26 09/11/2012 0910   CO2 29 03/05/2011 1232   CO2 26 09/09/2009 1331   BUN 17.7 09/11/2012 0910   BUN 14 03/05/2011 1232   BUN 14 09/09/2009 1331   CREATININE 1.0 09/11/2012 0910   CREATININE 1.0 03/05/2011 1232   CREATININE 1.04 09/09/2009 1331      Component Value Date/Time   CALCIUM 9.5 09/11/2012 0910   CALCIUM 9.0 03/05/2011 1232   CALCIUM 9.5 09/09/2009 1331   ALKPHOS 54 09/11/2012 0910   ALKPHOS 61 03/05/2011 1232   ALKPHOS 59 09/09/2009 1331   AST 23 09/11/2012 0910   AST 22 03/05/2011 1232   AST 19 09/09/2009 1331   ALT 25 09/11/2012 0910   ALT 26 03/05/2011 1232   ALT 12 09/09/2009 1331   BILITOT 1.28* 09/11/2012 0910   BILITOT 1.20 03/05/2011 1232   BILITOT 1.0 09/09/2009 1331       RADIOGRAPHIC STUDIES: Ct Chest W Contrast  09/11/2012   *RADIOLOGY REPORT*  Clinical Data: Patient with history of lung cancer diagnosed in 2010 status post left resection.  CT CHEST WITH CONTRAST  Technique:  Multidetector CT imaging of the chest was performed following the standard protocol during bolus administration of intravenous contrast.  Contrast: 80mL OMNIPAQUE IOHEXOL 300 MG/ML  SOLN  Comparison: Chest CT 08/08/2011  Findings: Visualized neck base is unremarkable.  No enlarged axillary, mediastinal hilar lymphadenopathy.  Normal heart size. Unchanged small pericardial effusion.  Normal caliber aorta and main pulmonary artery.  Central airways are patent.  Stable postsurgical change involving the left upper lobe.  Unchanged 6 mm nodule within the left lower lobe (image 44; series 5).  No new pulmonary nodules identified. No pleural effusion or pneumothorax.  Limited visualization of the upper abdomen demonstrates multiple stable low attenuation lesions within the liver including a 1.5 cm subcapsular low attenuation lesion (image 56; series 2), previously measuring the same. No aggressive appearing  osseous lesions.  IMPRESSION: 1.  Stable postoperative changes within the left upper lobe without evidence for recurrent or metastatic disease. 2.  Unchanged 6 mm left lower lobe pulmonary nodule. 3.  Stable hepatic lesions.   Original Report Authenticated By: Annia Belt, M.D    ASSESSMENT AND PLAN: This is a very pleasant 57 years old white female with history of stage IA non-small cell lung cancer, adenocarcinoma status post left lingulectomy with lymph node dissection and has been observation since January of 2007 was no evidence for disease recurrence. I discussed the scan results with the patient today.  I recommended for her to continue on observation with repeat CT scan of the chest in one year.  She was advised to call immediately if she has any concerning symptoms in the interval.  The patient voices understanding of current disease status  and treatment options and is in agreement with the current care plan.  All questions were answered. The patient knows to call the clinic with any problems, questions or concerns. We can certainly see the patient much sooner if necessary.

## 2012-09-16 NOTE — Patient Instructions (Signed)
CURRENT THERAPY: Observation.  CHEMOTHERAPY INTENT: Curative  CURRENT # OF CHEMOTHERAPY CYCLES: None  CURRENT ANTIEMETICS: None  CURRENT SMOKING STATUS: Former smoker  ORAL CHEMOTHERAPY AND CONSENT: None  CURRENT BISPHOSPHONATES USE: None  PAIN MANAGEMENT: 0/10  NARCOTICS INDUCED CONSTIPATION: None  LIVING WILL AND CODE STATUS: No CODE BLUE

## 2013-04-10 ENCOUNTER — Encounter: Payer: Self-pay | Admitting: Internal Medicine

## 2013-04-10 ENCOUNTER — Ambulatory Visit (INDEPENDENT_AMBULATORY_CARE_PROVIDER_SITE_OTHER): Payer: BC Managed Care – PPO | Admitting: Internal Medicine

## 2013-04-10 VITALS — BP 112/82 | HR 80 | Temp 98.2°F | Ht 63.0 in | Wt 126.4 lb

## 2013-04-10 DIAGNOSIS — I499 Cardiac arrhythmia, unspecified: Secondary | ICD-10-CM

## 2013-04-10 DIAGNOSIS — E785 Hyperlipidemia, unspecified: Secondary | ICD-10-CM

## 2013-04-10 DIAGNOSIS — R002 Palpitations: Secondary | ICD-10-CM

## 2013-04-10 DIAGNOSIS — K219 Gastro-esophageal reflux disease without esophagitis: Secondary | ICD-10-CM

## 2013-04-10 HISTORY — DX: Gastro-esophageal reflux disease without esophagitis: K21.9

## 2013-04-10 NOTE — Progress Notes (Signed)
Pre visit review using our clinic review tool, if applicable. No additional management support is needed unless otherwise documented below in the visit note. 

## 2013-04-10 NOTE — Progress Notes (Signed)
Subjective:    Patient ID: Dawn Campos, female    DOB: 05/12/1955, 58 y.o.   MRN: [id number]  HPI    Here with c/o approx 6 wks episodes palpitations/? irreg heartbeat assoc with a fullness in the center mid and upper chest/neck ? Convulsing like, with a flushing feeling, occas nausea. Choking feeling, and dizziness, occurs mult times per day. No sob, diaphoreis, lasts a few seconds each episode.  No vomiting.  Has occas reflux but this sensation is different.  Has regular CT scan with hx of lung ca, last done approx 4 mo ago without recurrence, s/p surgury for adenoCa lung, no xrt or cmt.  Denies hyper or hypo thyroid symptoms such as voice, skin or hair change. Has long hx of elev chol, strong FH of same, suspects strong genetic component, but most of her family are otherwise long lived.  Pt without dm, htn, tobacco.  Lost 34 lbs last yr intentionally in the past year.  Cont's work as Animal nutritionist.  Has not had TSH done recent.  CBC/cmet done 09/2012  - ok. Past Medical History  Diagnosis Date  . Sarcoidosis   . Personal history of other malignant neoplasm of skin   . Lung nodule   . Insomnia, unspecified   . Shortness of breath   . Personal history of colonic polyps   . Routine general medical examination at a health care facility   . Family history of malignant neoplasm of gastrointestinal tract   . Family history of malignant neoplasm of breast   . Alcoholism in family   . Other and unspecified hyperlipidemia   . Malignant neoplasm of bronchus and lung, unspecified site    Past Surgical History  Procedure Laterality Date  . Cesarean section  1995  . Tonsillectomy    . Basal cell carcinoma excision      s/p basal cell cancer Moh's surgery nose  . Lung surgery  2011    Resection lung nodule- adenocarcinoma 2011- Dr Ailene Ravel  . Mediastinoscopy  2011    Mediastinoscopy- noncaseating granulomas 2011    reports that she quit smoking about 4 years ago. Her smoking use  included Cigarettes. She smoked 0.00 packs per day. She does not have any smokeless tobacco history on file. She reports that she drinks alcohol. Her drug history is not on file. family history includes Alcohol abuse in her other; Arthritis in her maternal grandmother; Breast cancer in her other; Colon cancer in her other; Hyperlipidemia in her brother and father; Leukemia in her other; Lung cancer in her other; Ovarian cancer in her mother. Allergies  Allergen Reactions  . Amoxicillin     REACTION: itching, rash   Current Outpatient Prescriptions on File Prior to Visit  Medication Sig Dispense Refill  . doxycycline (ORACEA) 40 MG capsule Take 40 mg by mouth every morning.      Marland Kitchen ibuprofen (ADVIL,MOTRIN) 200 MG tablet Take as directed on bottle when needed       . polyethylene glycol (MIRALAX / GLYCOLAX) packet Take 17 g by mouth daily.       No current facility-administered medications on file prior to visit.    Review of Systems  Constitutional: Negative for unexpected weight change, or unusual diaphoresis  HENT: Negative for tinnitus.   Eyes: Negative for photophobia and visual disturbance.  Respiratory: Negative for choking and stridor.   Gastrointestinal: Negative for vomiting and blood in stool.  Genitourinary: Negative for hematuria and decreased urine volume.  Musculoskeletal: Negative  for acute joint swelling Skin: Negative for color change and wound.  Neurological: Negative for tremors and numbness other than noted  Psychiatric/Behavioral: Negative for decreased concentration or  hyperactivity.       Objective:   Physical Exam BP 112/82  Pulse 80  Temp(Src) 98.2 F (36.8 C) (Oral)  Ht 5\' 3"  (1.6 m)  Wt 126 lb 6 oz (57.323 kg)  BMI 22.39 kg/m2  SpO2 96%  VS noted,  Constitutional: Pt appears well-developed and well-nourished.  HENT: Head: NCAT.  Right Ear: External ear normal.  Left Ear: External ear normal.  Eyes: Conjunctivae and EOM are normal. Pupils are  equal, round, and reactive to light.  Neck: Normal range of motion. Neck supple.  Cardiovascular: Normal rate and regular rhythm.   Pulmonary/Chest: Effort normal and breath sounds normal.  Abd:  Soft, NT, non-distended, + BS Neurological: Pt is alert. Not confused  Skin: Skin is warm. No erythema.  Psychiatric: Pt behavior is normal. Thought content normal.       Assessment & Plan:

## 2013-04-10 NOTE — Assessment & Plan Note (Addendum)
ECG reviewed as per emr - no significant abnormality, I suspect frequent ectopic beats such as pvc, pac but will need 24 holter to r/o PAF.  Overall she feels her symptoms are annoying but no functionally limiting, will hold on further tx such as beta blocker suppression. For TSH with next labs

## 2013-04-10 NOTE — Assessment & Plan Note (Signed)
Persistent elev chol in past, last check several yrs ago, for lipids with next labs, pt would consider statin but despite LDL close to 200 in past, she is hesitant as her family is normally long lived

## 2013-04-10 NOTE — Assessment & Plan Note (Signed)
For PPI, cant r/o esoph spasm and even vagal episodes assoc with her symptoms

## 2013-04-10 NOTE — Patient Instructions (Addendum)
Your EKG was OK today  Please continue all other medications as before, and refills have been done if requested. Please have the pharmacy call with any other refills you may need.  Please go to the LAB in the Basement (turn left off the elevator) for the tests to be done at your convenience  You will be contacted by phone if any changes need to be made immediately.  Otherwise, you will receive a letter about your results with an explanation, but please check with MyChart first.  You will be contacted regarding the referral for: Holter Monitor

## 2013-04-20 ENCOUNTER — Encounter (INDEPENDENT_AMBULATORY_CARE_PROVIDER_SITE_OTHER): Payer: BC Managed Care – PPO

## 2013-04-20 ENCOUNTER — Encounter: Payer: Self-pay | Admitting: Radiology

## 2013-04-20 DIAGNOSIS — R002 Palpitations: Secondary | ICD-10-CM

## 2013-04-20 NOTE — Progress Notes (Unsigned)
Patient ID: Dawn Campos, female   DOB: February 08, 1955, 58 y.o.   MRN: [id number] E cardio 24hr holter applied

## 2013-05-15 ENCOUNTER — Telehealth: Payer: Self-pay | Admitting: *Deleted

## 2013-05-15 NOTE — Telephone Encounter (Signed)
Apparently the results are still pending at this time

## 2013-05-15 NOTE — Telephone Encounter (Signed)
Pt called requesting holter monitor results.  Please advise

## 2013-05-21 ENCOUNTER — Telehealth: Payer: Self-pay | Admitting: Cardiology

## 2013-05-21 NOTE — Telephone Encounter (Signed)
Monitor results.

## 2013-05-23 ENCOUNTER — Telehealth: Payer: Self-pay | Admitting: Internal Medicine

## 2013-05-23 ENCOUNTER — Encounter: Payer: Self-pay | Admitting: Internal Medicine

## 2013-05-23 DIAGNOSIS — I493 Ventricular premature depolarization: Secondary | ICD-10-CM

## 2013-05-23 HISTORY — DX: Ventricular premature depolarization: I49.3

## 2013-05-23 NOTE — Telephone Encounter (Signed)
Received results of heart monitor;   This showed "extra beats" only, no irreg or other serious heart rhythms  I can offer medication such as beta blocker to help if pt would like a way to control the symptoms (o/w the extra beats do not require other evaluation or treatment)  Robin to inform pt

## 2013-05-24 NOTE — Telephone Encounter (Signed)
Called the patient informed of results and MD instructions.  Patient is understood and does not want to treat with beta blocker at this time.

## 2013-05-24 NOTE — Telephone Encounter (Signed)
Called left message to call back 

## 2013-06-06 ENCOUNTER — Encounter: Payer: Self-pay | Admitting: Internal Medicine

## 2013-06-29 ENCOUNTER — Telehealth: Payer: Self-pay | Admitting: Internal Medicine

## 2013-06-29 NOTE — Telephone Encounter (Signed)
Rec'd from [place] forward 3 pages to Dr. Jenny Reichmann

## 2013-07-18 ENCOUNTER — Telehealth: Payer: Self-pay | Admitting: Internal Medicine

## 2013-07-18 NOTE — Telephone Encounter (Signed)
Rec'd from [place] forward 3 pages to Dr. Jenny Reichmann

## 2013-07-25 ENCOUNTER — Telehealth: Payer: Self-pay | Admitting: Internal Medicine

## 2013-07-25 NOTE — Telephone Encounter (Signed)
Re'cd records form [facility] Forwarding 3pgs to Dr.John,James

## 2013-09-14 ENCOUNTER — Ambulatory Visit (HOSPITAL_COMMUNITY)
Admission: RE | Admit: 2013-09-14 | Discharge: 2013-09-14 | Disposition: A | Discharge status: Home / Self Care | Payer: BC Managed Care – PPO | Source: Ambulatory Visit | Attending: Internal Medicine | Admitting: Internal Medicine

## 2013-09-14 ENCOUNTER — Encounter (HOSPITAL_COMMUNITY): Payer: Self-pay

## 2013-09-14 ENCOUNTER — Other Ambulatory Visit (HOSPITAL_BASED_OUTPATIENT_CLINIC_OR_DEPARTMENT_OTHER): Payer: BC Managed Care – PPO

## 2013-09-14 DIAGNOSIS — C3490 Malignant neoplasm of unspecified part of unspecified bronchus or lung: Secondary | ICD-10-CM

## 2013-09-14 DIAGNOSIS — R911 Solitary pulmonary nodule: Secondary | ICD-10-CM | POA: Diagnosis not present

## 2013-09-14 LAB — CBC WITH DIFFERENTIAL/PLATELET
BASO%: 1.2 % (ref 0.0–2.0)
BASOS ABS: 0.1 10*3/uL (ref 0.0–0.1)
EOS%: 1.7 % (ref 0.0–7.0)
Eosinophils Absolute: 0.1 10*3/uL (ref 0.0–0.5)
HEMATOCRIT: 39.4 % (ref 34.8–46.6)
HEMOGLOBIN: 12.7 g/dL (ref 11.6–15.9)
LYMPH#: 1.3 10*3/uL (ref 0.9–3.3)
LYMPH%: 27.7 % (ref 14.0–49.7)
MCH: 29.6 pg (ref 25.1–34.0)
MCHC: 32.2 g/dL (ref 31.5–36.0)
MCV: 92 fL (ref 79.5–101.0)
MONO#: 0.3 10*3/uL (ref 0.1–0.9)
MONO%: 7.2 % (ref 0.0–14.0)
NEUT#: 2.8 10*3/uL (ref 1.5–6.5)
NEUT%: 62.2 % (ref 38.4–76.8)
Platelets: 263 10*3/uL (ref 145–400)
RBC: 4.28 10*6/uL (ref 3.70–5.45)
RDW: 14.3 % (ref 11.2–14.5)
WBC: 4.6 10*3/uL (ref 3.9–10.3)

## 2013-09-14 LAB — COMPREHENSIVE METABOLIC PANEL (CC13)
ALBUMIN: 3.8 g/dL (ref 3.5–5.0)
ALT: 27 U/L (ref 0–55)
ANION GAP: 10 meq/L (ref 3–11)
AST: 23 U/L (ref 5–34)
Alkaline Phosphatase: 55 U/L (ref 40–150)
BUN: 13.2 mg/dL (ref 7.0–26.0)
CALCIUM: 9 mg/dL (ref 8.4–10.4)
CHLORIDE: 108 meq/L (ref 98–109)
CO2: 25 meq/L (ref 22–29)
CREATININE: 0.9 mg/dL (ref 0.6–1.1)
Glucose: 84 mg/dl (ref 70–140)
POTASSIUM: 4.4 meq/L (ref 3.5–5.1)
Sodium: 142 mEq/L (ref 136–145)
Total Bilirubin: 1.43 mg/dL — ABNORMAL HIGH (ref 0.20–1.20)
Total Protein: 6.7 g/dL (ref 6.4–8.3)

## 2013-09-14 MED ORDER — IOHEXOL 300 MG/ML  SOLN
80.0000 mL | Freq: Once | INTRAMUSCULAR | Status: AC | PRN
  Administered 2013-09-14: 80 mL via INTRAVENOUS

## 2013-09-17 ENCOUNTER — Ambulatory Visit (HOSPITAL_BASED_OUTPATIENT_CLINIC_OR_DEPARTMENT_OTHER): Payer: BC Managed Care – PPO | Admitting: Internal Medicine

## 2013-09-17 ENCOUNTER — Encounter: Payer: Self-pay | Admitting: Internal Medicine

## 2013-09-17 VITALS — BP 136/68 | HR 67 | Temp 98.0°F | Resp 18 | Ht 63.0 in | Wt 122.2 lb

## 2013-09-17 DIAGNOSIS — C3490 Malignant neoplasm of unspecified part of unspecified bronchus or lung: Secondary | ICD-10-CM

## 2013-09-17 DIAGNOSIS — Z85118 Personal history of other malignant neoplasm of bronchus and lung: Secondary | ICD-10-CM

## 2013-09-17 NOTE — Progress Notes (Signed)
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 This was diagnosed in January 2011.   PRIOR THERAPY: Status post left VATs with left lingulectomy and nodal dissection under the care of Dr. Arlyce Dice on January 24, 2009.   CURRENT THERAPY: Observation.  CHEMOTHERAPY INTENT: Curative  CURRENT # OF CHEMOTHERAPY CYCLES: None  CURRENT ANTIEMETICS: None  CURRENT SMOKING STATUS: Former smoker  ORAL CHEMOTHERAPY AND CONSENT: None  CURRENT BISPHOSPHONATES USE: None  PAIN MANAGEMENT: 0/10  NARCOTICS INDUCED CONSTIPATION: None  LIVING WILL AND CODE STATUS: No CODE BLUE  INTERVAL HISTORY: Dawn Campos 58 y.o. female returns to the clinic today for annual followup visit. The patient is feeling fine today with no specific complaints. She denied having any significant chest pain, shortness of breath, cough or hemoptysis. The patient denied having any further weight loss or night sweats. She has no nausea or vomiting. She had repeat CT scan of the chest performed recently and she is here for evaluation and discussion of her scan results. She is moving to [place] for her new job.   MEDICAL HISTORY: Past Medical History  Diagnosis Date  . Sarcoidosis   . Personal history of other malignant neoplasm of skin   . Lung nodule   . Insomnia, unspecified   . Shortness of breath   . Personal history of colonic polyps   . Routine general medical examination at a health care facility   . Family history of malignant neoplasm of gastrointestinal tract   . Family history of malignant neoplasm of breast   . Alcoholism in family   . Other and unspecified hyperlipidemia   . GERD (gastroesophageal reflux disease) 04/10/2013  . 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  Malignant neoplasm of bronchus and lung, unspecified site     ALLERGIES:  is allergic to amoxicillin.  MEDICATIONS:  Current Outpatient Prescriptions  Medication Sig Dispense Refill  . diphenhydrAMINE (BENADRYL) 25 MG tablet Take 25 mg by mouth every 6 (six) hours as needed (nightly prn).      Marland Kitchen doxycycline (ORACEA) 40 MG capsule Take 40 mg by mouth as needed.       Marland Kitchen ibuprofen (ADVIL,MOTRIN) 200 MG tablet Take as directed on bottle when needed       . polyethylene glycol (MIRALAX / GLYCOLAX) packet Take 17 g by mouth daily as needed.        No current facility-administered medications for this visit.    SURGICAL HISTORY:  Past Surgical History  Procedure Laterality Date  . Cesarean section  1995  . Tonsillectomy    . Basal cell carcinoma excision      s/p basal cell cancer Moh's surgery nose  . Lung surgery  2011    Resection lung nodule- adenocarcinoma 2011- Dr Ailene Ravel  . Mediastinoscopy  2011    Mediastinoscopy- noncaseating granulomas 2011    REVIEW OF SYSTEMS:  A comprehensive review of systems was negative.   PHYSICAL EXAMINATION: General appearance: alert, cooperative and no distress Head: Normocephalic, without obvious abnormality, atraumatic Neck: no adenopathy Lymph nodes: Cervical, supraclavicular, and axillary nodes normal. Resp: clear to auscultation bilaterally Cardio: regular rate and rhythm, S1, S2 normal, no murmur, click, rub or gallop GI: soft, non-tender; bowel sounds normal; no masses,  no organomegaly Extremities:  extremities normal, atraumatic, no cyanosis or edema  ECOG PERFORMANCE STATUS: 0 - Asymptomatic  Blood pressure 136/68, pulse 67, temperature 98 F (36.7 C), temperature source Oral, resp. rate 18, height $RemoveBe'5\' 3"'udeQmoTVZ$  (1.6 m), weight 122 lb 3.2 oz (55.43 kg), SpO2 100.00%.  LABORATORY DATA: Lab Results  Component Value Date   WBC 4.6 09/14/2013   HGB 12.7 09/14/2013   HCT 39.4 09/14/2013   MCV 92.0 09/14/2013   PLT 263 09/14/2013       Chemistry      Component Value Date/Time   NA 142 09/14/2013 0750   NA 139 03/05/2011 1232   NA 139 09/09/2009 1331   K 4.4 09/14/2013 0750   K 4.1 03/05/2011 1232   K 4.8 09/09/2009 1331   CL 103 09/08/2011 0813   CL 98 03/05/2011 1232   CL 107 09/09/2009 1331   CO2 25 09/14/2013 0750   CO2 29 03/05/2011 1232   CO2 26 09/09/2009 1331   BUN 13.2 09/14/2013 0750   BUN 14 03/05/2011 1232   BUN 14 09/09/2009 1331   CREATININE 0.9 09/14/2013 0750   CREATININE 1.0 03/05/2011 1232   CREATININE 1.04 09/09/2009 1331      Component Value Date/Time   CALCIUM 9.0 09/14/2013 0750   CALCIUM 9.0 03/05/2011 1232   CALCIUM 9.5 09/09/2009 1331   ALKPHOS 55 09/14/2013 0750   ALKPHOS 61 03/05/2011 1232   ALKPHOS 59 09/09/2009 1331   AST 23 09/14/2013 0750   AST 22 03/05/2011 1232   AST 19 09/09/2009 1331   ALT 27 09/14/2013 0750   ALT 26 03/05/2011 1232   ALT 12 09/09/2009 1331   BILITOT 1.43* 09/14/2013 0750   BILITOT 1.20 03/05/2011 1232   BILITOT 1.0 09/09/2009 1331       RADIOGRAPHIC STUDIES: Ct Chest W Contrast  09/14/2013   CLINICAL DATA:  History of stage I adenocarcinoma, status post left lingulectomy and node dissection in January of 2011. No current complaints.  EXAM: CT CHEST WITH CONTRAST  TECHNIQUE: Multidetector CT imaging of the chest was performed during intravenous contrast administration.  CONTRAST:  57mL OMNIPAQUE IOHEXOL 300 MG/ML  SOLN  COMPARISON:  09/11/2012  FINDINGS: Lungs/Pleura: Minimal subpleural regularity in the right lower lobe on image 36 is slightly more conspicuous today than on the prior exam, but favored to represent an area of scarring. Surgical changes within the left upper lobe.  Left lower lobe 7 x 6 mm sub solid pulmonary nodule is felt to be similar (when remeasured). Image 43 today.  Mild scarring in the left upper lobe which is unchanged laterally.  No pleural fluid.  Heart/Mediastinum: No supraclavicular adenopathy. Left jugular vein is mildly prominent on image 8. No cause identified. Aortic and  branch vessel atherosclerosis. Normal heart size. Trace anterior pericardial fluid or thickening is not significantly changed. No central pulmonary embolism, on this non-dedicated study. No mediastinal or hilar adenopathy.  Upper Abdomen: Normal imaged portions of the spleen, stomach, pancreas, adrenal glands, kidneys. Tiny hyperenhancing focus in the hepatic dome on image 55 at 3 mm is favored to represent a perfusion anomaly. Subcapsular right hepatic dome 1.4 cm low-density lesion on image 55 has been present back to 03/02/2010, likely a hemangioma.  Bones/Musculoskeletal:  No acute osseous abnormality.  IMPRESSION: 1. Status post resection of the lingular adenocarcinoma, without locally recurrent or metastatic disease. 2. Similar sub solid left lower lobe pulmonary nodule. Recommend attention on follow-up.   Electronically Signed   By: Adria Devon.D.  On: 09/14/2013 09:52   ASSESSMENT AND PLAN: This is a very pleasant 59 years old white female with history of stage IA non-small cell lung cancer, adenocarcinoma status post left lingulectomy with lymph node dissection and has been observation since January of 2007. Her recent scan showed no evidence for disease recurrence.  I discussed the scan results with the patient today.  I recommended for her to continue on observation.  As recommended for the patient to establish care with a medical oncologist in [place] once she moves there. She was advised to call immediately if she has any concerning symptoms in the interval.  The patient voices understanding of current disease status and treatment options and is in agreement with the current care plan.  All questions were answered. The patient knows to call the clinic with any problems, questions or concerns. We can certainly see the patient much sooner if necessary.  Disclaimer: This note was dictated with voice recognition software. Similar sounding words can inadvertently be transcribed and may be  missed upon review.

## 2016-06-03 ENCOUNTER — Telehealth: Payer: Self-pay | Admitting: *Deleted

## 2016-06-03 NOTE — Telephone Encounter (Signed)
"  I am a former patient of Dr. Julien Nordmann.  I moved to [place].  My current doctors have scheduled me for a PET scan June 6 th.  I need to know if Dr. Earlie Server thought the 6 mm nodule in the left lung was benign in the beginning due to it's shape, size, texture or whatever.  How did they judge what they found when I was scanned there September 2015?   A recent scan showed 1 mm growth.  I still believe this nodule is not important but the doctors here are worried after three years.  I kept my [facility] number and can be reached at [phone]."

## 2016-06-04 ENCOUNTER — Telehealth: Payer: Self-pay | Admitting: Medical Oncology

## 2016-06-04 NOTE — Telephone Encounter (Signed)
Curt Bears, MD  Ardeen Garland, RN        If it is increased in size, it is need to be addressed. PET scan may be ok once it is close to 10 mm. Other option is repeat CT scan in 3-6 months.    Also per Dr Julien Nordmann I told pt to f/u with her Physician recommendation.

## 2016-06-07 ENCOUNTER — Telehealth: Payer: Self-pay | Admitting: Internal Medicine

## 2016-06-07 NOTE — Telephone Encounter (Signed)
Faxed records to Dr. Jetty Duhamel [phone]

## 2018-09-20 ENCOUNTER — Telehealth: Payer: Self-pay | Admitting: *Deleted

## 2018-09-20 NOTE — Telephone Encounter (Signed)
Records faxed to [facility] - att Christa See - Release [phone]
